# Patient Record
Sex: Male | Born: 1990 | Race: Black or African American | Hispanic: No | Marital: Married | Smoking: Former smoker
Health system: Southern US, Community
[De-identification: ages and names within clinical notes are randomized; demographics above are authoritative.]

## PROBLEM LIST (undated history)

## (undated) DIAGNOSIS — I10 Essential (primary) hypertension: Secondary | ICD-10-CM

## (undated) DIAGNOSIS — F909 Attention-deficit hyperactivity disorder, unspecified type: Secondary | ICD-10-CM

## (undated) HISTORY — PX: OTHER SURGICAL HISTORY: SHX169

---
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
## 2015-07-03 ENCOUNTER — Emergency Department: Payer: Managed Care, Other (non HMO)

## 2015-07-03 ENCOUNTER — Encounter: Payer: Self-pay | Admitting: Emergency Medicine

## 2015-07-03 ENCOUNTER — Emergency Department
Admission: EM | Admit: 2015-07-03 | Discharge: 2015-07-03 | Disposition: A | Discharge status: Home / Self Care | Payer: Managed Care, Other (non HMO) | Attending: Emergency Medicine | Admitting: Emergency Medicine

## 2015-07-03 DIAGNOSIS — Z79899 Other long term (current) drug therapy: Secondary | ICD-10-CM | POA: Diagnosis not present

## 2015-07-03 DIAGNOSIS — Y998 Other external cause status: Secondary | ICD-10-CM | POA: Insufficient documentation

## 2015-07-03 DIAGNOSIS — Y9289 Other specified places as the place of occurrence of the external cause: Secondary | ICD-10-CM | POA: Diagnosis not present

## 2015-07-03 DIAGNOSIS — S43015A Anterior dislocation of left humerus, initial encounter: Secondary | ICD-10-CM | POA: Insufficient documentation

## 2015-07-03 DIAGNOSIS — M25512 Pain in left shoulder: Secondary | ICD-10-CM

## 2015-07-03 DIAGNOSIS — Y9389 Activity, other specified: Secondary | ICD-10-CM | POA: Insufficient documentation

## 2015-07-03 DIAGNOSIS — Z87891 Personal history of nicotine dependence: Secondary | ICD-10-CM | POA: Diagnosis not present

## 2015-07-03 DIAGNOSIS — F1010 Alcohol abuse, uncomplicated: Secondary | ICD-10-CM | POA: Diagnosis not present

## 2015-07-03 DIAGNOSIS — S4992XA Unspecified injury of left shoulder and upper arm, initial encounter: Secondary | ICD-10-CM | POA: Diagnosis present

## 2015-07-03 DIAGNOSIS — W1839XA Other fall on same level, initial encounter: Secondary | ICD-10-CM | POA: Insufficient documentation

## 2015-07-03 MED ORDER — PROPOFOL 10 MG/ML IV BOLUS
INTRAVENOUS | Status: AC | PRN
  Administered 2015-07-03: 95 mg via INTRAVENOUS

## 2015-07-03 MED ORDER — ETOMIDATE 2 MG/ML IV SOLN
0.3000 mg/kg | Freq: Once | INTRAVENOUS | Status: AC
  Administered 2015-07-03: 27.9 mg via INTRAVENOUS

## 2015-07-03 MED ORDER — OXYCODONE-ACETAMINOPHEN 5-325 MG PO TABS: 1.0000 | ORAL_TABLET | Freq: Four times a day (QID) | ORAL | Status: DC | PRN

## 2015-07-03 MED ORDER — ONDANSETRON HCL 4 MG/2ML IJ SOLN
4.0000 mg | Freq: Once | INTRAMUSCULAR | Status: DC
Start: 2015-07-03 — End: 2015-07-03
  Filled 2015-07-03: qty 2

## 2015-07-03 MED ORDER — PROPOFOL 10 MG/ML IV BOLUS
INTRAVENOUS | Status: AC | PRN
  Administered 2015-07-03 (×2): 45 mg via INTRAVENOUS
  Administered 2015-07-03: 93 mg via INTRAVENOUS

## 2015-07-03 MED ORDER — PROPOFOL 1000 MG/100ML IV EMUL
INTRAVENOUS | Status: AC
  Administered 2015-07-03: 46.5 mg via INTRAVENOUS
  Filled 2015-07-03: qty 100

## 2015-07-03 MED ORDER — ONDANSETRON HCL 4 MG/2ML IJ SOLN
INTRAMUSCULAR | Status: AC
  Filled 2015-07-03: qty 2

## 2015-07-03 MED ORDER — PROPOFOL 10 MG/ML IV BOLUS
2.0000 mg/kg | Freq: Once | INTRAVENOUS | Status: AC
  Administered 2015-07-03: 186 mg via INTRAVENOUS

## 2015-07-03 MED ORDER — BUPIVACAINE HCL (PF) 0.5 % IJ SOLN
30.0000 mL | Freq: Once | INTRAMUSCULAR | Status: AC
  Administered 2015-07-03: 30 mL
  Filled 2015-07-03: qty 30

## 2015-07-03 MED ORDER — HYDROMORPHONE HCL 1 MG/ML IJ SOLN
INTRAMUSCULAR | Status: AC
  Administered 2015-07-03: 1 mg via INTRAVENOUS
  Filled 2015-07-03: qty 1

## 2015-07-03 MED ORDER — IBUPROFEN 200 MG PO TABS: 600.0000 mg | ORAL_TABLET | Freq: Four times a day (QID) | ORAL | Status: DC | PRN

## 2015-07-03 MED ORDER — HYDROMORPHONE HCL 1 MG/ML IJ SOLN
1.0000 mg | Freq: Once | INTRAMUSCULAR | Status: AC
  Administered 2015-07-03: 1 mg via INTRAVENOUS

## 2015-07-03 MED ORDER — LIDOCAINE HCL (PF) 1 % IJ SOLN
30.0000 mL | Freq: Once | INTRAMUSCULAR | Status: AC
  Administered 2015-07-03: 30 mL
  Filled 2015-07-03: qty 30

## 2015-07-03 MED ORDER — LIDOCAINE HCL (PF) 1 % IJ SOLN
INTRAMUSCULAR | Status: AC
  Administered 2015-07-03: 30 mL
  Filled 2015-07-03: qty 20

## 2015-07-03 MED ORDER — PROPOFOL 10 MG/ML IV BOLUS
0.5000 mg/kg | Freq: Once | INTRAVENOUS | Status: AC
  Administered 2015-07-03: 46.5 mg via INTRAVENOUS

## 2015-07-03 MED ORDER — SODIUM CHLORIDE 0.9 % IV BOLUS (SEPSIS): 1000.0000 mL | Freq: Once | INTRAVENOUS | Status: DC

## 2015-07-03 MED ORDER — ETOMIDATE 2 MG/ML IV SOLN
INTRAVENOUS | Status: AC
  Administered 2015-07-03: 27.9 mg via INTRAVENOUS
  Filled 2015-07-03: qty 10

## 2015-07-03 NOTE — ED Notes (Addendum)
Pt reports was drinking last night and fell landing on left shoulder.  Pt thinks is dislocated.  Did not realize last night so waited until this morning to come in.  Left shoulder appears possibly anteriorly dislocated in triage.

## 2015-07-03 NOTE — ED Provider Notes (Signed)
[facility] Emergency Department Provider Note  ____________________________________________  Time seen: 9:20 AM  I have reviewed the triage vital signs and the nursing notes.   HISTORY  Chief Complaint Shoulder Pain    HPI Nathan Beck is a 25 y.o. male complains of left shoulder pain worse with movement since last night. He reports he was drinking a lot and had a half a bottle of whiskey last night, and while intoxicated he fell down on his left arm. He then slept the rest of the night and when he woke up this morning he noticed that his arm was really hurting and difficult to move. No other injuries, no headache numbness tingling or weakness, chest pain shortness breath or abdominal pain. No vomiting or diarrhea     History reviewed. No pertinent past medical history.   There are no active problems to display for this patient.    History reviewed. No pertinent past surgical history.   Current Outpatient Rx  Name  Route  Sig  Dispense  Refill  . amphetamine-dextroamphetamine (ADDERALL) 20 MG tablet   Oral   Take 20 mg by mouth 2 (two) times daily.            Allergies Review of patient's allergies indicates no known allergies.   History reviewed. No pertinent family history.  Social History Social History  Substance Use Topics  . Smoking status: Former Games developer  . Smokeless tobacco: None  . Alcohol Use: Yes    Review of Systems  Constitutional:   No fever or chills. No weight changes Eyes:   No blurry vision or double vision.  ENT:   No sore throat. Cardiovascular:   No chest pain. Respiratory:   No dyspnea or cough. Gastrointestinal:   Negative for abdominal pain, vomiting and diarrhea.  No BRBPR or melena. Genitourinary:   Negative for dysuria, urinary retention, bloody urine, or difficulty urinating. Musculoskeletal:   Left shoulder pain as above. Skin:   Negative for rash. Neurological:   Negative for headaches, focal  weakness or numbness. Psychiatric:  No anxiety or depression.   Endocrine:  No hot/cold intolerance, changes in energy, or sleep difficulty.  10-point ROS otherwise negative.  ____________________________________________   PHYSICAL EXAM:  VITAL SIGNS: ED Triage Vitals  Enc Vitals Group     BP 07/03/15 0917 149/75 mmHg     Pulse Rate 07/03/15 0917 109     Resp 07/03/15 0917 18     Temp 07/03/15 0917 98.1 F (36.7 C)     Temp Source 07/03/15 0917 Oral     SpO2 07/03/15 0917 96 %     Weight 07/03/15 0917 205 lb (92.987 kg)     Height 07/03/15 0917  (1.778 m)     Head Cir --      Peak Flow --      Pain Score 07/03/15 0918 8     Pain Loc --      Pain Edu? --      Excl. in GC? --     Vital signs reviewed, nursing assessments reviewed.   Constitutional:   Alert and oriented. Well appearing and in no distress. Eyes:   No scleral icterus. No conjunctival pallor. PERRL. EOMI ENT   Head:   Normocephalic and atraumatic.   Nose:   No congestion/rhinnorhea. No septal hematoma   Mouth/Throat:   MMM, no pharyngeal erythema. No peritonsillar mass. No uvula shift.   Neck:   No stridor. No SubQ emphysema. No meningismus.  Hematological/Lymphatic/Immunilogical:   No cervical lymphadenopathy. Cardiovascular:   RRR. Normal and symmetric distal pulses are present in all extremities. No murmurs, rubs, or gallops. Respiratory:   Normal respiratory effort without tachypnea nor retractions. Breath sounds are clear and equal bilaterally. No wheezes/rales/rhonchi. Gastrointestinal:   Soft and nontender. No distention. There is no CVA tenderness.  No rebound, rigidity, or guarding. Genitourinary:   deferred Musculoskeletal:   Tenderness around the left shoulder. There is anterior inferior fullness with humeral head palpable in the axilla. There is soft tissue emptiness over the lateral deltoid. In the distribution of the axillary nerve he does report some paresthesia with light  touch.. Neurologic:   Normal speech and language.  CN 2-10 normal. Motor grossly intact. Normal gait. No gross focal neurologic deficits are appreciated.  Skin:    Skin is warm, dry and intact. No rash noted.  No petechiae, purpura, or bullae. Psychiatric:   Mood and affect are normal. Speech and behavior are normal. Patient exhibits appropriate insight and judgment.  ____________________________________________    LABS (pertinent positives/negatives) (all labs ordered are listed, but only abnormal results are displayed) Labs Reviewed - No data to display ____________________________________________   EKG    ____________________________________________    RADIOLOGY  Initial x-ray left shoulder shows anterior dislocation Repeat x-ray left shoulder shows persistent anterior dislocation of left shoulder, no change. Third x-ray left shoulder shows persistent anterior dislocation of left shoulder no change. Fourth x-ray left shoulder report at 4:15 PM shows successful reduction without any other injuries. ____________________________________________   PROCEDURES Procedural sedation Performed by: Sharman Cheek Consent: Verbal and written consent obtained. Risks and benefits: risks, benefits and alternatives were discussed Required items: required blood products, implants, devices, and special equipment available Patient identity confirmed: arm band and provided demographic data Time out: Immediately prior to procedure a "time out" was called to verify the correct patient, procedure, equipment, support staff and site/side marked as required.  Sedation type: moderate (conscious) sedation for reduction of left shoulder dislocation .NPO time confirmed and considedered. Nothing by mouth since yesterday.  Sedatives: ETOMIDATE 28 mg   Physician Time at Bedside: 15 minutes  Vitals: Vital signs were monitored during sedation. Cardiac Monitor, pulse oximeter Patient tolerance:  Patient tolerated the procedure well with no immediate complications. Comments: Pt with uneventful recovered. Returned to pre-procedural sedation baseline. Sedation start time 12:05 PM Sedation end time 12:20 PM, patient back to baseline. Patient achieved only light sedation and experienced twitching related to the etomidate briefly.  Reduction of dislocation Date/Time: 12:10pm Performed by: Scotty Court, Alphus Zeck Authorized by: Sharman Cheek Consent: Verbal consent obtained. Risks and benefits: risks, benefits and alternatives were discussed Consent given by: patient Required items: required blood products, implants, devices, and special equipment available Time out: Immediately prior to procedure a "time out" was called to verify the correct patient, procedure, equipment, support staff and site/side marked as required.  Patient sedated: Etomidate yes  Vitals: Vital signs were monitored during sedation. Patient tolerance: Patient tolerated the procedure well with no immediate complications. Joint: Shoulder Reduction technique: Traction countertraction, external rotation and abduction  Attempt unsuccessful.     Procedural sedation Performed by: Sharman Cheek Consent: Verbal and written consent obtained. Risks and benefits: risks, benefits and alternatives were discussed Required items: required blood products, implants, devices, and special equipment available Patient identity confirmed: arm band and provided demographic data Time out: Immediately prior to procedure a "time out" was called to verify the correct patient, procedure, equipment, support staff and site/side marked as  required.  Sedation type: moderate (conscious) sedation NPO time confirmed and considedered  Sedatives: PROPOFOL  Physician Time at Bedside: 25 minutes  Vitals: Vital signs were monitored during sedation. Cardiac Monitor, pulse oximeter Patient tolerance: Patient expresses bradycardia without  hypotension with lowest heart rate of about 38. He also initially experienced hypoxia with lowest oxygen saturation of 80%, rapidly returned 100% with admionistration of nonrebreather oxygen. . Comments: Pt with uneventful recovered. Returned to pre-procedural sedation baseline  sedation start time 12:50 PM Sedation end time 1:30 PM Total propofol given 190 mg  Reduction of dislocation Date/Time: 1:00pm Performed by: Scotty Court, Brick Ketcher Authorized by: Sharman Cheek Consent: Verbal consent obtained. Risks and benefits: risks, benefits and alternatives were discussed Consent given by: patient Required items: required blood products, implants, devices, and special equipment available Time out: Immediately prior to procedure a "time out" was called to verify the correct patient, procedure, equipment, support staff and site/side marked as required.  Patient sedated: Yes with a propofol  Vitals: Vital signs were monitored during sedation. Patient tolerance: Patient tolerated the procedure well with no immediate complications. Joint: Left shoulder Reduction technique: Traction countertraction, external rotation and abduction   attempt unsuccessful   Procedural sedation Performed by: Scotty Court, Holland Nickson Consent: Verbal consent obtained. Risks and benefits: risks, benefits and alternatives were discussed Required items: required blood products, implants, devices, and special equipment available Patient identity confirmed: arm band and provided demographic data Time out: Immediately prior to procedure a "time out" was called to verify the correct patient, procedure, equipment, support staff and site/side marked as required.  Sedation type: moderate (conscious) sedation NPO time confirmed and considedered  Sedatives: PROPOFOL 2 mg/kg   Physician Time at Bedside: 15 minutes  Vitals: Vital signs were monitored during sedation. Cardiac Monitor, pulse oximeter Patient tolerance: Patient  tolerated the procedure well with no immediate complications. Comments: Pt with uneventful recovered. Returned to pre-procedural sedation baseline  patient was placed on nonrebreather prior to administration of propofol out of caution, did not have any episodes of hypoxia during the procedure.      ____________________________________________   INITIAL IMPRESSION / ASSESSMENT AND PLAN / ED COURSE  Pertinent labs & imaging results that were available during my care of the patient were reviewed by me and considered in my medical decision making (see chart for details).  Patient presents with left shoulder anterior dislocation. The shoulder is been out of joint for 10-12 hours. The patient is very muscular and I can feel that the area is very tensed up. Multiple attempts at sedation and reduction were unsuccessful. Because of this, I discussed the case with orthopedics Dr. Joice Lofts at 2:20 PM who will evaluate the patient for continued management. Patient has received 2 doses of IV Dilaudid for pain control in the meantime.    ----------------------------------------- 3:50 PM on 07/03/2015 -----------------------------------------  Patient sedated the third time with Dr. Joice Lofts at the bedside to do reduction. Clinically it appears that this reduction was successful. Will follow-up with x-ray and then plan to discharge for clinic follow-up.   ----------------------------------------- 4:18 PM on 07/03/2015 -----------------------------------------  Reduction successful confirmed by follow-up x-ray. We'll discharge home in shoulder immobilizer with NSAIDs and Percocet follow-up with Dr. Joice Lofts.  ____________________________________________   FINAL CLINICAL IMPRESSION(S) / ED DIAGNOSES  Final diagnoses:  Anterior dislocation of left shoulder, initial encounter  Alcohol abuse      Sharman Cheek, MD 07/03/15 [phone]

## 2015-07-03 NOTE — Sedation Documentation (Signed)
After first dose of Propofol, pt began to desat on monitor, placed on non rebreather. Vitals stable at this time

## 2015-07-03 NOTE — Sedation Documentation (Signed)
Pt taken off non rebreather without difficulty, tolerating RA

## 2015-07-03 NOTE — Discharge Instructions (Signed)
You were prescribed a medication that is potentially sedating. Do not drink alcohol, drive or participate in any other potentially dangerous activities while taking this medication as it may make you sleepy. Do not take this medication with any other sedating medications, either prescription or over-the-counter. If you were prescribed Percocet or Vicodin, do not take these with acetaminophen (Tylenol) as it is already contained within these medications.   Opioid pain medications (or "narcotics") can be habit forming.  Use it as little as possible to achieve adequate pain control.  Do not use or use it with extreme caution if you have a history of opiate abuse or dependence.  If you are on a pain contract with your primary care doctor or a pain specialist, be sure to let them know you were prescribed this medication today from the [facility] Emergency Department.  This medication is intended for your use only - do not give any to anyone else and keep it in a secure place where nobody else, especially children and pets, have access to it.  It will also cause or worsen constipation, so you may want to consider taking an over-the-counter stool softener while you are taking this medication.  Shoulder Dislocation A shoulder dislocation happens when the upper arm bone (humerus) moves out of the shoulder joint. The shoulder joint is the part of the shoulder where the humerus, shoulder blade (scapula), and collarbone (clavicle) meet. CAUSES This condition is often caused by:  A fall.  A hit to the shoulder.  A forceful movement of the shoulder. RISK FACTORS This condition is more likely to develop in people who play sports. SYMPTOMS Symptoms of this condition include:  Deformity of the shoulder.  Intense pain.  Inability to move the shoulder.  Numbness, weakness, or tingling in your neck or down your arm.  Bruising or swelling around your shoulder. DIAGNOSIS This condition is diagnosed  with a physical exam. After the exam, tests may be done to check for related problems. Tests that may be done include:  X-ray. This may be done to check for broken bones.  MRI. This may be done to check for damage to the tissues around the shoulder.  Electromyogram. This may be done to check for nerve damage. TREATMENT This condition is treated with a procedure to place the humerus back in the joint. This procedure is called a reduction. There are two types of reduction:  Closed reduction. In this procedure, the humerus is placed back in the joint without surgery. The health care provider uses his or her hands to guide the bone back into place.  Open reduction. In this procedure, the humerus is placed back in the joint with surgery. An open reduction may be recommended if:  You have a weak shoulder joint or weak ligaments.  You have had more than one shoulder dislocation.  The nerves or blood vessels around your shoulder have been damaged. After the humerus is placed back into the joint, your arm will be placed in a splint or sling to prevent it from moving. You will need to wear the splint or sling until your shoulder heals. When the splint or sling is removed, you may have physical therapy to help improve the range of motion in your shoulder joint. HOME CARE INSTRUCTIONS If You Have a Splint or Sling:  Wear it as told by your health care provider. Remove it only as told by your health care provider.  Loosen it if your fingers become numb and tingle,  or if they turn cold and blue.  Keep it clean and dry. Bathing  Do not take baths, swim, or use a hot tub until your health care provider approves. Ask your health care provider if you can take showers. You may only be allowed to take sponge baths for bathing.  If your health care provider approves bathing and showering, cover your splint or sling with a watertight plastic bag to protect it from water. Do not let the splint or sling get  wet. Managing Pain, Stiffness, and Swelling  If directed, apply ice to the injured area.  Put ice in a plastic bag.  Place a towel between your skin and the bag.  Leave the ice on for 20 minutes, 2-3 times per day.  Move your fingers often to avoid stiffness and to decrease swelling.  Raise (elevate) the injured area above the level of your heart while you are sitting or lying down. Driving  Do not drive while wearing a splint or sling on a hand that you use for driving.  Do not drive or operate heavy machinery while taking pain medicine. Activity  Return to your normal activities as told by your health care provider. Ask your health care provider what activities are safe for you.  Perform range-of-motion exercises only as told by your health care provider.  Exercise your hand by squeezing a soft ball. This helps to decrease stiffness and swelling in your hand and wrist. General Instructions  Take over-the-counter and prescription medicines only as told by your health care provider.  Do not use any tobacco products, including cigarettes, chewing tobacco, or e-cigarettes. Tobacco can delay bone and tissue healing. If you need help quitting, ask your health care provider.  Keep all follow-up visits as told by your health care provider. This is important. SEEK MEDICAL CARE IF:  Your splint or sling gets damaged. SEEK IMMEDIATE MEDICAL CARE IF:  Your pain gets worse rather than better.  You lose feeling in your arm or hand.  Your arm or hand becomes white and cold.   This information is not intended to replace advice given to you by your health care provider. Make sure you discuss any questions you have with your health care provider.   Document Released: 01/30/2001 Document Revised: 01/26/2015 Document Reviewed: 08/30/2014 Elsevier Interactive Patient Education 2016 [employer]  Shoulder Pain The shoulder is the joint that connects your arms to your body. The bones  that form the shoulder joint include the upper arm bone (humerus), the shoulder blade (scapula), and the collarbone (clavicle). The top of the humerus is shaped like a ball and fits into a rather flat socket on the scapula (glenoid cavity). A combination of muscles and strong, fibrous tissues that connect muscles to bones (tendons) support your shoulder joint and hold the ball in the socket. Small, fluid-filled sacs (bursae) are located in different areas of the joint. They act as cushions between the bones and the overlying soft tissues and help reduce friction between the gliding tendons and the bone as you move your arm. Your shoulder joint allows a wide range of motion in your arm. This range of motion allows you to do things like scratch your back or throw a ball. However, this range of motion also makes your shoulder more prone to pain from overuse and injury. Causes of shoulder pain can originate from both injury and overuse and usually can be grouped in the following four categories:  Redness, swelling, and pain (inflammation) of  the tendon (tendinitis) or the bursae (bursitis).  Instability, such as a dislocation of the joint.  Inflammation of the joint (arthritis).  Broken bone (fracture). HOME CARE INSTRUCTIONS   Apply ice to the sore area.  Put ice in a plastic bag.  Place a towel between your skin and the bag.  Leave the ice on for 15-20 minutes, 3-4 times per day for the first 2 days, or as directed by your health care provider.  Stop using cold packs if they do not help with the pain.  If you have a shoulder sling or immobilizer, wear it as long as your caregiver instructs. Only remove it to shower or bathe. Move your arm as little as possible, but keep your hand moving to prevent swelling.  Squeeze a soft ball or foam pad as much as possible to help prevent swelling.  Only take over-the-counter or prescription medicines for pain, discomfort, or fever as directed by your  caregiver. SEEK MEDICAL CARE IF:   Your shoulder pain increases, or new pain develops in your arm, hand, or fingers.  Your hand or fingers become cold and numb.  Your pain is not relieved with medicines. SEEK IMMEDIATE MEDICAL CARE IF:   Your arm, hand, or fingers are numb or tingling.  Your arm, hand, or fingers are significantly swollen or turn white or blue. MAKE SURE YOU:   Understand these instructions.  Will watch your condition.  Will get help right away if you are not doing well or get worse.   This information is not intended to replace advice given to you by your health care provider. Make sure you discuss any questions you have with your health care provider.   Document Released: 02/14/2005 Document Revised: 05/28/2014 Document Reviewed: 08/30/2014 Elsevier Interactive Patient Education [employer]

## 2021-10-09 ENCOUNTER — Inpatient Hospital Stay
Admission: EM | Admit: 2021-10-09 | Discharge: 2021-10-11 | DRG: 392 | Disposition: A | Discharge status: Home / Self Care | Payer: BC Managed Care – PPO | Attending: Obstetrics and Gynecology | Admitting: Obstetrics and Gynecology

## 2021-10-09 ENCOUNTER — Emergency Department: Payer: BC Managed Care – PPO

## 2021-10-09 ENCOUNTER — Encounter: Payer: Self-pay | Admitting: Emergency Medicine

## 2021-10-09 ENCOUNTER — Other Ambulatory Visit: Payer: Self-pay

## 2021-10-09 ENCOUNTER — Inpatient Hospital Stay: Payer: BC Managed Care – PPO

## 2021-10-09 DIAGNOSIS — Z8249 Family history of ischemic heart disease and other diseases of the circulatory system: Secondary | ICD-10-CM

## 2021-10-09 DIAGNOSIS — R112 Nausea with vomiting, unspecified: Secondary | ICD-10-CM | POA: Diagnosis present

## 2021-10-09 DIAGNOSIS — N50811 Right testicular pain: Secondary | ICD-10-CM

## 2021-10-09 DIAGNOSIS — K5732 Diverticulitis of large intestine without perforation or abscess without bleeding: Principal | ICD-10-CM | POA: Diagnosis present

## 2021-10-09 DIAGNOSIS — Z20822 Contact with and (suspected) exposure to covid-19: Secondary | ICD-10-CM | POA: Diagnosis present

## 2021-10-09 DIAGNOSIS — K5792 Diverticulitis of intestine, part unspecified, without perforation or abscess without bleeding: Secondary | ICD-10-CM | POA: Diagnosis not present

## 2021-10-09 DIAGNOSIS — F909 Attention-deficit hyperactivity disorder, unspecified type: Secondary | ICD-10-CM | POA: Diagnosis not present

## 2021-10-09 DIAGNOSIS — R0902 Hypoxemia: Secondary | ICD-10-CM | POA: Diagnosis not present

## 2021-10-09 DIAGNOSIS — R1030 Lower abdominal pain, unspecified: Secondary | ICD-10-CM

## 2021-10-09 DIAGNOSIS — Z87891 Personal history of nicotine dependence: Secondary | ICD-10-CM | POA: Diagnosis not present

## 2021-10-09 DIAGNOSIS — I10 Essential (primary) hypertension: Secondary | ICD-10-CM | POA: Diagnosis present

## 2021-10-09 DIAGNOSIS — N433 Hydrocele, unspecified: Secondary | ICD-10-CM | POA: Diagnosis present

## 2021-10-09 HISTORY — DX: Attention-deficit hyperactivity disorder, unspecified type: F90.9

## 2021-10-09 HISTORY — DX: Essential (primary) hypertension: I10

## 2021-10-09 LAB — URINALYSIS, ROUTINE W REFLEX MICROSCOPIC
Bacteria, UA: NONE SEEN
Bilirubin Urine: NEGATIVE
Glucose, UA: NEGATIVE mg/dL
Ketones, ur: 20 mg/dL — AB
Leukocytes,Ua: NEGATIVE
Nitrite: NEGATIVE
Protein, ur: NEGATIVE mg/dL
Specific Gravity, Urine: >1.046 — ABNORMAL HIGH (ref 1.005–1.030)
Squamous Epithelial / HPF: NONE SEEN (ref 0–5)
pH: 7 (ref 5.0–8.0)

## 2021-10-09 LAB — COMPREHENSIVE METABOLIC PANEL
ALT: 13 U/L (ref 0–44)
AST: 26 U/L (ref 15–41)
Albumin: 4.1 g/dL (ref 3.5–5.0)
Alkaline Phosphatase: 54 U/L (ref 38–126)
Anion gap: 11 (ref 5–15)
BUN: 20 mg/dL (ref 6–20)
CO2: 22 mmol/L (ref 22–32)
Calcium: 9.1 mg/dL (ref 8.9–10.3)
Chloride: 102 mmol/L (ref 98–111)
Creatinine, Ser: 1.1 mg/dL (ref 0.61–1.24)
GFR, Estimated: >60 mL/min (ref >60)
Glucose, Bld: 160 mg/dL — ABNORMAL HIGH (ref 70–99)
Potassium: 3.5 mmol/L (ref 3.5–5.1)
Sodium: 135 mmol/L (ref 135–145)
Total Bilirubin: 1.5 mg/dL — ABNORMAL HIGH (ref 0.3–1.2)
Total Protein: 7.7 g/dL (ref 6.5–8.1)

## 2021-10-09 LAB — CBC WITH DIFFERENTIAL/PLATELET
Abs Immature Granulocytes: 0.04 10*3/uL (ref 0.00–0.07)
Basophils Absolute: 0 10*3/uL (ref 0.0–0.1)
Basophils Relative: 0 %
Eosinophils Absolute: 0 10*3/uL (ref 0.0–0.5)
Eosinophils Relative: 0 %
HCT: 40.9 % (ref 39.0–52.0)
Hemoglobin: 13.7 g/dL (ref 13.0–17.0)
Immature Granulocytes: 0 %
Lymphocytes Relative: 11 %
Lymphs Abs: 1.2 10*3/uL (ref 0.7–4.0)
MCH: 31.2 pg (ref 26.0–34.0)
MCHC: 33.5 g/dL (ref 30.0–36.0)
MCV: 93.2 fL (ref 80.0–100.0)
Monocytes Absolute: 0.7 10*3/uL (ref 0.1–1.0)
Monocytes Relative: 6 %
Neutro Abs: 8.8 10*3/uL — ABNORMAL HIGH (ref 1.7–7.7)
Neutrophils Relative %: 83 %
Platelets: 240 10*3/uL (ref 150–400)
RBC: 4.39 MIL/uL (ref 4.22–5.81)
RDW: 12.2 % (ref 11.5–15.5)
WBC: 10.8 10*3/uL — ABNORMAL HIGH (ref 4.0–10.5)
nRBC: 0 % (ref 0.0–0.2)

## 2021-10-09 LAB — RESP PANEL BY RT-PCR (FLU A&B, COVID) ARPGX2
Influenza A by PCR: NEGATIVE
Influenza B by PCR: NEGATIVE
SARS Coronavirus 2 by RT PCR: NEGATIVE

## 2021-10-09 LAB — PROTIME-INR
INR: 1.1 (ref 0.8–1.2)
Prothrombin Time: 13.7 seconds (ref 11.4–15.2)

## 2021-10-09 LAB — HIV ANTIBODY (ROUTINE TESTING W REFLEX): HIV Screen 4th Generation wRfx: NONREACTIVE

## 2021-10-09 LAB — LIPASE, BLOOD: Lipase: 24 U/L (ref 11–51)

## 2021-10-09 MED ORDER — HYDRALAZINE HCL 20 MG/ML IJ SOLN: 5.0000 mg | INTRAMUSCULAR | Status: DC | PRN

## 2021-10-09 MED ORDER — SODIUM CHLORIDE 0.9 % IV SOLN: INTRAVENOUS | Status: DC

## 2021-10-09 MED ORDER — ONDANSETRON HCL 4 MG/2ML IJ SOLN
4.0000 mg | Freq: Once | INTRAMUSCULAR | Status: AC
  Administered 2021-10-09: 4 mg via INTRAVENOUS
  Filled 2021-10-09: qty 2

## 2021-10-09 MED ORDER — MORPHINE SULFATE (PF) 2 MG/ML IV SOLN
2.0000 mg | INTRAVENOUS | Status: DC | PRN
  Administered 2021-10-09: 2 mg via INTRAVENOUS
  Filled 2021-10-09: qty 1

## 2021-10-09 MED ORDER — IOHEXOL 300 MG/ML  SOLN
100.0000 mL | Freq: Once | INTRAMUSCULAR | Status: AC | PRN
  Administered 2021-10-09: 100 mL via INTRAVENOUS

## 2021-10-09 MED ORDER — MORPHINE SULFATE (PF) 2 MG/ML IV SOLN
2.0000 mg | Freq: Once | INTRAVENOUS | Status: DC
  Filled 2021-10-09: qty 1

## 2021-10-09 MED ORDER — KETOROLAC TROMETHAMINE 30 MG/ML IJ SOLN
30.0000 mg | Freq: Once | INTRAMUSCULAR | Status: AC
  Administered 2021-10-09: 30 mg via INTRAVENOUS
  Filled 2021-10-09: qty 1

## 2021-10-09 MED ORDER — DM-GUAIFENESIN ER 30-600 MG PO TB12: 1.0000 | ORAL_TABLET | Freq: Two times a day (BID) | ORAL | Status: DC | PRN

## 2021-10-09 MED ORDER — MORPHINE SULFATE (PF) 4 MG/ML IV SOLN
4.0000 mg | Freq: Once | INTRAVENOUS | Status: AC
  Administered 2021-10-09: 4 mg via INTRAVENOUS
  Filled 2021-10-09: qty 1

## 2021-10-09 MED ORDER — ACETAMINOPHEN 325 MG PO TABS: 650.0000 mg | ORAL_TABLET | Freq: Four times a day (QID) | ORAL | Status: DC | PRN

## 2021-10-09 MED ORDER — PIPERACILLIN-TAZOBACTAM 3.375 G IVPB 30 MIN
3.3750 g | Freq: Once | INTRAVENOUS | Status: AC
Start: 2021-10-09 — End: 2021-10-09
  Administered 2021-10-09: 3.375 g via INTRAVENOUS
  Filled 2021-10-09: qty 50

## 2021-10-09 MED ORDER — ONDANSETRON HCL 4 MG/2ML IJ SOLN: 4.0000 mg | Freq: Three times a day (TID) | INTRAMUSCULAR | Status: DC | PRN

## 2021-10-09 MED ORDER — ENOXAPARIN SODIUM 40 MG/0.4ML IJ SOSY
40.0000 mg | PREFILLED_SYRINGE | INTRAMUSCULAR | Status: DC
  Administered 2021-10-09 – 2021-10-10 (×2): 40 mg via SUBCUTANEOUS
  Filled 2021-10-09 (×2): qty 0.4

## 2021-10-09 MED ORDER — ALBUTEROL SULFATE (2.5 MG/3ML) 0.083% IN NEBU
2.5000 mg | INHALATION_SOLUTION | RESPIRATORY_TRACT | Status: DC | PRN
Start: 2021-10-09 — End: 2021-10-11

## 2021-10-09 MED ORDER — OXYCODONE-ACETAMINOPHEN 5-325 MG PO TABS
1.0000 | ORAL_TABLET | ORAL | Status: DC | PRN
  Administered 2021-10-10 – 2021-10-11 (×3): 1 via ORAL
  Filled 2021-10-09 (×3): qty 1

## 2021-10-09 MED ORDER — PIPERACILLIN-TAZOBACTAM 3.375 G IVPB
3.3750 g | Freq: Three times a day (TID) | INTRAVENOUS | Status: DC
  Administered 2021-10-09 – 2021-10-11 (×6): 3.375 g via INTRAVENOUS
  Filled 2021-10-09 (×6): qty 50

## 2021-10-09 MED ORDER — SODIUM CHLORIDE 0.9 % IV BOLUS (SEPSIS)
1000.0000 mL | Freq: Once | INTRAVENOUS | Status: AC
  Administered 2021-10-09: 1000 mL via INTRAVENOUS

## 2021-10-09 NOTE — Assessment & Plan Note (Signed)
Patient developed oxygen desaturation after received 2 dose of IV morphine.  Patient denies any chest pain or shortness breath currently.  Oxygen saturation 100% on 2 L oxygen currently.  Chest x-ray negative. -Supportive care -As needed albuterol -Check COVID19 PCR

## 2021-10-09 NOTE — H&P (Signed)
History and Physical    Nathan Beck PJK:[id number] DOB: 1990-05-31 DOA: 10/09/2021  Referring MD/NP/PA:   PCP: Pcp, No   Patient coming from:  The patient is coming from home.  At baseline, pt is independent for most of ADL.        Chief Complaint: Abdominal pain  HPI: Nathan Beck is a 31 y.o. male with medical history significant of hypertension, ADHD, who presents with abdominal pain.  Patient states that his abdomen started yesterday, which is located in the lower abdomen, constant, initially 10 out of 10 in severity, currently 4 out of 10 in severity, sharp, radiating to the peritoneal area.  Associated with nausea and multiple episodes of nonbilious nonbloody vomiting.  Patient can not take anything down. No diarrhea.  No fever or chills.  Patient does not have chest pain, cough, shortness breath.  No symptoms of UTI.  Per report, patient had normal oxygen saturation initially, but after receiving 2 doses of morphine (2 mg and 4 mg of IV) in ED, patient had oxygen desaturation to 70s% on room air, which improved to 100% on 2 L oxygen.  Denies any shortness breath chest pain or shortness breath.  Data Reviewed and ED Course: pt was found to have WBC 10.8, lipase 24, normal liver function, GFR> 60, temperature normal, blood pressure 132/87, heart rate 90, 66, 18.  CT abdomen/pelvis that showed sigmoid diverticulitis without abscess formation.  Patient is admitted to MedSurg bed as inpatient  US-scrotum: Unremarkable scrotal ultrasound. Specifically, no findings to suggest testicular torsion or epididymal orchitis. No testicular mass lesion.  CT-abd/pelvis 1. Diffuse colonic diverticulosis with ill-defined wall thickening in the sigmoid colon with pericolonic edema/inflammation. Imaging features are most suggestive of sigmoid colon diverticulitis. No evidence for perforation or abscess. 2. Tiny hiatal hernia.   EKG: Not done in ED, will get one.    Review of Systems:    General: no fevers, chills, no body weight gain, has poor appetite, has fatigue HEENT: no blurry vision, hearing changes or sore throat Respiratory: no dyspnea, coughing, wheezing CV: no chest pain, no palpitations GI: has nausea, vomiting, abdominal pain, no diarrhea, constipation GU: no dysuria, burning on urination, increased urinary frequency, hematuria  Ext: no leg edema Neuro: no unilateral weakness, numbness, or tingling, no vision change or hearing loss Skin: no rash, no skin tear. MSK: No muscle spasm, no deformity, no limitation of range of movement in spin Heme: No easy bruising.  Travel history: No recent long distant travel.   Allergy: No Known Allergies  Past Medical History:  Diagnosis Date   ADHD    HTN (hypertension)     Past Surgical History:  Procedure Laterality Date   dental procedure      Social History:  reports that he has quit smoking. He has never used smokeless tobacco. He reports that he does not currently use alcohol. He reports that he does not use drugs.  Family History:  Family History  Problem Relation Age of Onset   Hypertension Mother    Diverticulitis Mother    Hypertension Father      Prior to Admission medications   Medication Sig Start Date End Date Taking? Authorizing Provider  amphetamine-dextroamphetamine (ADDERALL) 20 MG tablet Take 20 mg by mouth 2 (two) times daily. Patient not taking: Reported on 10/09/2021 07/01/15   [provider]  ibuprofen (MOTRIN IB) 200 MG tablet Take 3 tablets (600 mg total) by mouth every 6 (six) hours as needed. Patient not taking:  Reported on 10/09/2021 07/03/15   Sharman Cheek, MD  oxyCODONE-acetaminophen (ROXICET) 5-325 MG tablet Take 1 tablet by mouth every 6 (six) hours as needed for severe pain. Patient not taking: Reported on 10/09/2021 07/03/15   Sharman Cheek, MD    Physical Exam: Vitals:   10/09/21 0610 10/09/21 0626 10/09/21 0707 10/09/21 0930  BP:   132/87 122/70   Pulse: 61 60 66 (!) 56  Resp:   18 18  Temp:      TempSrc:      SpO2: (!) 81% 100% 100% 100%  Weight:      Height:       General: Not in acute distress HEENT:       Eyes: PERRL, EOMI, no scleral icterus.       ENT: No discharge from the ears and nose, no pharynx injection, no tonsillar enlargement.        Neck: No JVD, no bruit, no mass felt. Heme: No neck lymph node enlargement. Cardiac: S1/S2, RRR, No murmurs, No gallops or rubs. Respiratory: No rales, wheezing, rhonchi or rubs. GI: Soft, nondistended, has tenderness in lower abdomen. no rebound pain, no organomegaly, BS present. GU: No hematuria Ext: No pitting leg edema bilaterally. 1+DP/PT pulse bilaterally. Musculoskeletal: No joint deformities, No joint redness or warmth, no limitation of ROM in spin. Skin: No rashes.  Neuro: Alert, oriented X3, cranial nerves II-XII grossly intact, moves all extremities normally.  Psych: Patient is not psychotic, no suicidal or hemocidal ideation.  Labs on Admission: I have personally reviewed following labs and imaging studies  CBC: Recent Labs  Lab 10/09/21 0604  WBC 10.8*  NEUTROABS 8.8*  HGB 13.7  HCT 40.9  MCV 93.2  PLT 240   Basic Metabolic Panel: Recent Labs  Lab 10/09/21 0604  NA 135  K 3.5  CL 102  CO2 22  GLUCOSE 160*  BUN 20  CREATININE 1.10  CALCIUM 9.1   GFR: Estimated Creatinine Clearance: 110.2 mL/min (by C-G formula based on SCr of 1.1 mg/dL). Liver Function Tests: Recent Labs  Lab 10/09/21 0604  AST 26  ALT 13  ALKPHOS 54  BILITOT 1.5*  PROT 7.7  ALBUMIN 4.1   Recent Labs  Lab 10/09/21 0604  LIPASE 24   No results for input(s): AMMONIA in the last 168 hours. Coagulation Profile: Recent Labs  Lab 10/09/21 0604  INR 1.1   Cardiac Enzymes: No results for input(s): CKTOTAL, CKMB, CKMBINDEX, TROPONINI in the last 168 hours. BNP (last 3 results) No results for input(s): PROBNP in the last 8760 hours. HbA1C: No results for input(s):  HGBA1C in the last 72 hours. CBG: No results for input(s): GLUCAP in the last 168 hours. Lipid Profile: No results for input(s): CHOL, HDL, LDLCALC, TRIG, CHOLHDL, LDLDIRECT in the last 72 hours. Thyroid Function Tests: No results for input(s): TSH, T4TOTAL, FREET4, T3FREE, THYROIDAB in the last 72 hours. Anemia Panel: No results for input(s): VITAMINB12, FOLATE, FERRITIN, TIBC, IRON, RETICCTPCT in the last 72 hours. Urine analysis: No results found for: COLORURINE, APPEARANCEUR, LABSPEC, PHURINE, GLUCOSEU, HGBUR, BILIRUBINUR, KETONESUR, PROTEINUR, UROBILINOGEN, NITRITE, LEUKOCYTESUR Sepsis Labs: @LABRCNTIP (procalcitonin:4,lacticidven:4) )No results found for this or any previous visit (from the past 240 hour(s)).   Radiological Exams on Admission: CT ABDOMEN PELVIS W CONTRAST  Result Date: 10/09/2021 CLINICAL DATA:  Abdominal pain and vomiting. EXAM: CT ABDOMEN AND PELVIS WITH CONTRAST TECHNIQUE: Multidetector CT imaging of the abdomen and pelvis was performed using the standard protocol following bolus administration of intravenous contrast. RADIATION DOSE REDUCTION:  This exam was performed according to the departmental dose-optimization program which includes automated exposure control, adjustment of the mA and/or kV according to patient size and/or use of iterative reconstruction technique. CONTRAST:  100mL OMNIPAQUE IOHEXOL 300 MG/ML  SOLN COMPARISON:  None Available. FINDINGS: Lower chest: Unremarkable. Hepatobiliary: No suspicious focal abnormality within the liver parenchyma. There is no evidence for gallstones, gallbladder wall thickening, or pericholecystic fluid. No intrahepatic or extrahepatic biliary dilation. Pancreas: No focal mass lesion. No dilatation of the main duct. No intraparenchymal cyst. No peripancreatic edema. Spleen: No splenomegaly. No focal mass lesion. Adrenals/Urinary Tract: No adrenal nodule or mass. Kidneys unremarkable. No evidence for hydroureter. The urinary  bladder appears normal for the degree of distention. Stomach/Bowel: Tiny hiatal hernia. Stomach is unremarkable. No gastric wall thickening. No evidence of outlet obstruction. No small bowel wall thickening. No small bowel dilatation. The terminal ileum is normal. The appendix is normal. Diverticular changes are seen in the colon diffusely. There is ill-defined wall thickening in the sigmoid colon with pericolonic edema/inflammation. Imaging features are most suggestive of sigmoid colon diverticulitis. No evidence for perforation or abscess. Vascular/Lymphatic: No abdominal aortic aneurysm. No abdominal aortic atherosclerotic calcification. There is no gastrohepatic or hepatoduodenal ligament lymphadenopathy. No retroperitoneal or mesenteric lymphadenopathy. No pelvic sidewall lymphadenopathy. Reproductive: The prostate gland and seminal vesicles are unremarkable. Other: Trace free fluid noted in the pelvis. Musculoskeletal: Tiny umbilical hernia contains only fat. No worrisome lytic or sclerotic osseous abnormality. IMPRESSION: 1. Diffuse colonic diverticulosis with ill-defined wall thickening in the sigmoid colon with pericolonic edema/inflammation. Imaging features are most suggestive of sigmoid colon diverticulitis. No evidence for perforation or abscess. 2. Tiny hiatal hernia. Electronically Signed   By: Kennith CenterEric  Mansell M.D.   On: 10/09/2021 07:10   DG Chest Port 1 View  Result Date: 10/09/2021 CLINICAL DATA:  Shortness of breath. EXAM: PORTABLE CHEST 1 VIEW COMPARISON:  None Available. FINDINGS: The heart size and mediastinal contours are within normal limits. Both lungs are clear. The visualized skeletal structures are unremarkable. IMPRESSION: No active disease. Electronically Signed   By: Obie DredgeWilliam T Derry M.D.   On: 10/09/2021 10:05   US SCROTUM W/DOPPLER  Result Date: 10/09/2021 CLINICAL DATA:  2 day history of right testicular pain. EXAM: SCROTAL ULTRASOUND DOPPLER ULTRASOUND OF THE TESTICLES  TECHNIQUE: Complete ultrasound examination of the testicles, epididymis, and other scrotal structures was performed. Color and spectral Doppler ultrasound were also utilized to evaluate blood flow to the testicles. COMPARISON:  None Available. FINDINGS: Right testicle Measurements: 4.6 x 2.6 x 2.3 cm. No mass or microlithiasis visualized. Left testicle Measurements: 4.6 x 2.2 x 2.4 cm. No mass or microlithiasis visualized. Right epididymis:  Normal in size and appearance. Left epididymis:  Normal in size and appearance. Hydrocele:  Physiologic volume fluid identified in each hemiscrotum. Varicocele:  None visualized. Pulsed Doppler interrogation of both testes demonstrates normal low resistance arterial and venous waveforms bilaterally. IMPRESSION: Unremarkable scrotal ultrasound. Specifically, no findings to suggest testicular torsion or epididymal orchitis. No testicular mass lesion. Electronically Signed   By: Kennith CenterEric  Mansell M.D.   On: 10/09/2021 08:05      Assessment/Plan Principal Problem:   Acute diverticulitis Active Problems:   ADHD   Oxygen desaturation   HTN (hypertension)   Principal Problem:   Acute diverticulitis Active Problems:   ADHD   Oxygen desaturation   HTN (hypertension)   Assessment and Plan: * Acute diverticulitis CT showed diffuse colonic diverticulosis with ill-defined wall thickening in the sigmoid colon with pericolonic edema/inflammation.  Imaging features are most suggestive of sigmoid colon diverticulitis. No evidence for perforation or abscess. Pt cannot take anything down due to nausea vomiting.  Patient does not meets criteria for sepsis -Admitted to MedSurg bed as inpatient -IV Zosyn started -Follow-up blood culture -As needed morphine and Percocet for pain -IV fluid: 1 L normal saline, then 100 cc/h -Clear diet for now, will advance diet as tolerated  HTN (hypertension) Blood pressure 132/87.  Patient likely has borderline hypertension, not taking  medications currently -IV hydralazine as needed  Oxygen desaturation Patient developed oxygen desaturation after received 2 dose of IV morphine.  Patient denies any chest pain or shortness breath currently.  Oxygen saturation 100% on 2 L oxygen currently.  Chest x-ray negative. -Supportive care -As needed albuterol -Check COVID19 PCR  ADHD Stable.  Patient not taking medications currently. -Observe closely             DVT ppx:  SQ Lovenox  Code Status: Full code  Family Communication:  Yes, patient's wife   at bed side.      Disposition Plan:  Anticipate discharge back to previous environment  Consults called:  none  Admission status and  Level of care: Med-Surg:   as inpt      Severity of Illness:  The appropriate patient status for this patient is INPATIENT. Inpatient status is judged to be reasonable and necessary in order to provide the required intensity of service to ensure the patient's safety. The patient's presenting symptoms, physical exam findings, and initial radiographic and laboratory data in the context of their chronic comorbidities is felt to place them at high risk for further clinical deterioration. Furthermore, it is not anticipated that the patient will be medically stable for discharge from the hospital within 2 midnights of admission.   * I certify that at the point of admission it is my clinical judgment that the patient will require inpatient hospital care spanning beyond 2 midnights from the point of admission due to high intensity of service, high risk for further deterioration and high frequency of surveillance required.*       Date of Service 10/09/2021    Lorretta Harp [facility]   If 7PM-7AM, please contact night-coverage [email or web address] 10/09/2021, 10:12 AM

## 2021-10-09 NOTE — ED Notes (Signed)
Breakfast tray given at this time. Pt also given a cup of water, pt is on a clear liquid diet.   Pt also given urinal and instructed to call this RN when urine sample is able to be obtained. Pt verbalized understanding.

## 2021-10-09 NOTE — ED Notes (Signed)
Lunch meal tray given at this time.  

## 2021-10-09 NOTE — ED Notes (Signed)
Report to ashley, rn

## 2021-10-09 NOTE — ED Notes (Signed)
Pt to ct scan.

## 2021-10-09 NOTE — Assessment & Plan Note (Signed)
CT showed diffuse colonic diverticulosis with ill-defined wall thickening in the sigmoid colon with pericolonic edema/inflammation. Imaging features are most suggestive of sigmoid colon diverticulitis. No evidence for perforation or abscess. Pt cannot take anything down due to nausea vomiting.  Patient does not meets criteria for sepsis.  Preliminary blood cultures negative. -Continue IV Zosyn  -As needed morphine and Percocet for pain -Continue IV fluid until he was able to take more p.o. intake -Clear diet for now, will advance diet as tolerated

## 2021-10-09 NOTE — ED Notes (Signed)
Pt requesting another dose of Toradol at this time. States that it helped with the pain but did not think he needed the morphine. Dr. Blaine Hamper, attending MD messaged at this time.

## 2021-10-09 NOTE — ED Triage Notes (Signed)
Pt arrived via POV with reports of lower abd pain and vomiting, pt states the pain starts in the lower abd and radiates towards testicles and rectum.  Pt vomited about 200cc bile in triage.

## 2021-10-09 NOTE — ED Provider Notes (Signed)
[facility]    Event Date/Time   First MD Initiated Contact with Patient 10/09/21 [phone]     (approximate)   History   Abdominal Pain and Emesis   HPI  Nathan Beck is a 31 y.o. male with history of ADHD, hypertension who presents to the emergency department with complaints of diffuse lower abdominal pain that started yesterday.  Pain radiates into both testicles and into his rectum.  He has had nausea, vomiting.  No diarrhea.  No known fevers.  No previous abdominal surgery.  States he feels like he is having a hard time urinating but no dysuria, penile discharge, scrotal swelling.   History provided by patient and significant other.    History reviewed. No pertinent past medical history.  History reviewed. No pertinent surgical history.  MEDICATIONS:  Prior to Admission medications   Medication Sig Start Date End Date Taking? Authorizing Provider  amphetamine-dextroamphetamine (ADDERALL) 20 MG tablet Take 20 mg by mouth 2 (two) times daily. 07/01/15   [provider]  ibuprofen (MOTRIN IB) 200 MG tablet Take 3 tablets (600 mg total) by mouth every 6 (six) hours as needed. 07/03/15   Sharman CheekStafford, Phillip, MD  oxyCODONE-acetaminophen (ROXICET) 5-325 MG tablet Take 1 tablet by mouth every 6 (six) hours as needed for severe pain. 07/03/15   Sharman CheekStafford, Phillip, MD    Physical Exam   Triage Vital Signs: ED Triage Vitals  Enc Vitals Group     BP 10/09/21 0530 (!) 111/93     Pulse Rate 10/09/21 0530 92     Resp 10/09/21 0530 18     Temp 10/09/21 0530 97.9 F (36.6 C)     Temp Source 10/09/21 0530 Oral     SpO2 10/09/21 0530 100 %     Weight 10/09/21 0528 200 lb (90.7 kg)     Height 10/09/21 0528 5\' 10"  (1.778 m)     Head Circumference --      Peak Flow --      Pain Score 10/09/21 0527 10     Pain Loc --      Pain Edu? --      Excl. in GC? --     Most recent vital signs: Vitals:   10/09/21 0626 10/09/21 0707  BP:  132/87   Pulse: 60 66  Resp:  18  Temp:    SpO2: 100% 100%    CONSTITUTIONAL: Alert and oriented and responds appropriately to questions.  Appears uncomfortable HEAD: Normocephalic, atraumatic EYES: Conjunctivae clear, pupils appear equal, sclera nonicteric ENT: normal nose; moist mucous membranes NECK: Supple, normal ROM CARD: RRR; S1 and S2 appreciated; no murmurs, no clicks, no rubs, no gallops RESP: Normal chest excursion without splinting or tachypnea; breath sounds clear and equal bilaterally; no wheezes, no rhonchi, no rales, no hypoxia or respiratory distress, speaking full sentences ABD/GI: Normal bowel sounds; non-distended; soft, diffusely tender throughout the lower abdomen with guarding, no rebound GU:  Normal external genitalia, circumcised male, normal penile shaft, no blood or discharge at the urethral meatus, patient has right testicular tenderness on exam without swelling, masses appreciated, no left testicular masses or tenderness on exam, no scrotal masses or swelling, no hernias appreciated, 2+ femoral pulses bilaterally; no perineal erythema, warmth, subcutaneous air or crepitus; no high riding testicle, normal bilateral cremasteric reflex.  Chaperone present for exam. BACK: The back appears normal EXT: Normal ROM in all joints; no deformity noted, no edema; no cyanosis SKIN: Normal color for age  and race; warm; no rash on exposed skin NEURO: Moves all extremities equally, normal speech PSYCH: The patient's mood and manner are appropriate.   ED Results / Procedures / Treatments   LABS: (all labs ordered are listed, but only abnormal results are displayed) Labs Reviewed  CBC WITH DIFFERENTIAL/PLATELET - Abnormal; Notable for the following components:      Result Value   WBC 10.8 (*)    Neutro Abs 8.8 (*)    All other components within normal limits  COMPREHENSIVE METABOLIC PANEL - Abnormal; Notable for the following components:   Glucose, Bld 160 (*)    Total Bilirubin  1.5 (*)    All other components within normal limits  LIPASE, BLOOD  URINALYSIS, ROUTINE W REFLEX MICROSCOPIC     EKG:   RADIOLOGY: My personal review and interpretation of imaging: CT scan shows diverticulitis.  Normal appendix.  I have personally reviewed all radiology reports.   CT ABDOMEN PELVIS W CONTRAST  Result Date: 10/09/2021 CLINICAL DATA:  Abdominal pain and vomiting. EXAM: CT ABDOMEN AND PELVIS WITH CONTRAST TECHNIQUE: Multidetector CT imaging of the abdomen and pelvis was performed using the standard protocol following bolus administration of intravenous contrast. RADIATION DOSE REDUCTION: This exam was performed according to the departmental dose-optimization program which includes automated exposure control, adjustment of the mA and/or kV according to patient size and/or use of iterative reconstruction technique. CONTRAST:  OMNIPAQUE IOHEXOL 300 MG/ML  SOLN COMPARISON:  None Available. FINDINGS: Lower chest: Unremarkable. Hepatobiliary: No suspicious focal abnormality within the liver parenchyma. There is no evidence for gallstones, gallbladder wall thickening, or pericholecystic fluid. No intrahepatic or extrahepatic biliary dilation. Pancreas: No focal mass lesion. No dilatation of the main duct. No intraparenchymal cyst. No peripancreatic edema. Spleen: No splenomegaly. No focal mass lesion. Adrenals/Urinary Tract: No adrenal nodule or mass. Kidneys unremarkable. No evidence for hydroureter. The urinary bladder appears normal for the degree of distention. Stomach/Bowel: Tiny hiatal hernia. Stomach is unremarkable. No gastric wall thickening. No evidence of outlet obstruction. No small bowel wall thickening. No small bowel dilatation. The terminal ileum is normal. The appendix is normal. Diverticular changes are seen in the colon diffusely. There is ill-defined wall thickening in the sigmoid colon with pericolonic edema/inflammation. Imaging features are most suggestive of  sigmoid colon diverticulitis. No evidence for perforation or abscess. Vascular/Lymphatic: No abdominal aortic aneurysm. No abdominal aortic atherosclerotic calcification. There is no gastrohepatic or hepatoduodenal ligament lymphadenopathy. No retroperitoneal or mesenteric lymphadenopathy. No pelvic sidewall lymphadenopathy. Reproductive: The prostate gland and seminal vesicles are unremarkable. Other: Trace free fluid noted in the pelvis. Musculoskeletal: Tiny umbilical hernia contains only fat. No worrisome lytic or sclerotic osseous abnormality. IMPRESSION: 1. Diffuse colonic diverticulosis with ill-defined wall thickening in the sigmoid colon with pericolonic edema/inflammation. Imaging features are most suggestive of sigmoid colon diverticulitis. No evidence for perforation or abscess. 2. Tiny hiatal hernia. Electronically Signed   By: Kennith Center M.D.   On: 10/09/2021 07:10     PROCEDURES:  Critical Care performed: No     Procedures    IMPRESSION / MDM / ASSESSMENT AND PLAN / ED COURSE  I reviewed the triage vital signs and the nursing notes.    Patient here with lower abdominal pain that radiates into the testicles and rectum.     DIFFERENTIAL DIAGNOSIS (includes but not limited to):   Colitis, diverticulitis, appendicitis, UTI, kidney stone, pyelonephritis, testicular torsion, orchitis, epididymitis, STD   PLAN: We will obtain CBC, CMP, lipase, urinalysis, CT of the  abdomen pelvis and ultrasound of the right testicle with Doppler.  We will give IV fluids, pain and nausea medicine.   MEDICATIONS GIVEN IN ED: Medications  morphine (PF) 4 MG/ML injection 4 mg (4 mg Intravenous Given 10/09/21 0603)  ondansetron (ZOFRAN) injection 4 mg (4 mg Intravenous Given 10/09/21 0603)  sodium chloride 0.9 % bolus 1,000 mL (1,000 mLs Intravenous New Bag/Given 10/09/21 0603)  iohexol (OMNIPAQUE) 300 MG/ML solution 100 mL (100 mLs Intravenous Contrast Given 10/09/21 0637)     ED COURSE:  Patient's labs show leukocytosis of 10.8 with left shift.  Normal LFTs, lipase, renal function.  CT of the abdomen pelvis reviewed and interpreted by myself and radiologist and shows sigmoid diverticulitis without perforation or abscess.  Urinalysis and testicular ultrasound pending.  Signed out the oncoming ED physician.   I reviewed all nursing notes and pertinent previous records as available.  I have reviewed and interpreted any and all EKGs, lab and urine results, imaging and radiology reports (as available).    CONSULTS: Admission possible given patient has severe pain with diverticulitis.   OUTSIDE RECORDS REVIEWED: Reviewed patient's last office visit with Dr. Marcello Fennel on 05/09/2017.         FINAL CLINICAL IMPRESSION(S) / ED DIAGNOSES   Final diagnoses:  Lower abdominal pain  Pain in right testicle  Sigmoid diverticulitis     Rx / DC Orders   ED Discharge Orders     None        Note:  This document was prepared using Dragon voice recognition software and may include unintentional dictation errors.   Beatrice Ziehm, Layla Maw, DO 10/09/21 [phone]

## 2021-10-09 NOTE — ED Notes (Signed)
Dr. Niu, MD at bedside at this time.  

## 2021-10-09 NOTE — ED Provider Notes (Signed)
CT ABDOMEN PELVIS W CONTRAST  Result Date: 10/09/2021 CLINICAL DATA:  Abdominal pain and vomiting. EXAM: CT ABDOMEN AND PELVIS WITH CONTRAST TECHNIQUE: Multidetector CT imaging of the abdomen and pelvis was performed using the standard protocol following bolus administration of intravenous contrast. RADIATION DOSE REDUCTION: This exam was performed according to the departmental dose-optimization program which includes automated exposure control, adjustment of the mA and/or kV according to patient size and/or use of iterative reconstruction technique. CONTRAST:  OMNIPAQUE IOHEXOL 300 MG/ML  SOLN COMPARISON:  None Available. FINDINGS: Lower chest: Unremarkable. Hepatobiliary: No suspicious focal abnormality within the liver parenchyma. There is no evidence for gallstones, gallbladder wall thickening, or pericholecystic fluid. No intrahepatic or extrahepatic biliary dilation. Pancreas: No focal mass lesion. No dilatation of the main duct. No intraparenchymal cyst. No peripancreatic edema. Spleen: No splenomegaly. No focal mass lesion. Adrenals/Urinary Tract: No adrenal nodule or mass. Kidneys unremarkable. No evidence for hydroureter. The urinary bladder appears normal for the degree of distention. Stomach/Bowel: Tiny hiatal hernia. Stomach is unremarkable. No gastric wall thickening. No evidence of outlet obstruction. No small bowel wall thickening. No small bowel dilatation. The terminal ileum is normal. The appendix is normal. Diverticular changes are seen in the colon diffusely. There is ill-defined wall thickening in the sigmoid colon with pericolonic edema/inflammation. Imaging features are most suggestive of sigmoid colon diverticulitis. No evidence for perforation or abscess. Vascular/Lymphatic: No abdominal aortic aneurysm. No abdominal aortic atherosclerotic calcification. There is no gastrohepatic or hepatoduodenal ligament lymphadenopathy. No retroperitoneal or mesenteric lymphadenopathy. No pelvic  sidewall lymphadenopathy. Reproductive: The prostate gland and seminal vesicles are unremarkable. Other: Trace free fluid noted in the pelvis. Musculoskeletal: Tiny umbilical hernia contains only fat. No worrisome lytic or sclerotic osseous abnormality. IMPRESSION: 1. Diffuse colonic diverticulosis with ill-defined wall thickening in the sigmoid colon with pericolonic edema/inflammation. Imaging features are most suggestive of sigmoid colon diverticulitis. No evidence for perforation or abscess. 2. Tiny hiatal hernia. Electronically Signed   By: Kennith Center M.D.   On: 10/09/2021 07:10   US SCROTUM W/DOPPLER  Result Date: 10/09/2021 CLINICAL DATA:  2 day history of right testicular pain. EXAM: SCROTAL ULTRASOUND DOPPLER ULTRASOUND OF THE TESTICLES TECHNIQUE: Complete ultrasound examination of the testicles, epididymis, and other scrotal structures was performed. Color and spectral Doppler ultrasound were also utilized to evaluate blood flow to the testicles. COMPARISON:  None Available. FINDINGS: Right testicle Measurements: 4.6 x 2.6 x 2.3 cm. No mass or microlithiasis visualized. Left testicle Measurements: 4.6 x 2.2 x 2.4 cm. No mass or microlithiasis visualized. Right epididymis:  Normal in size and appearance. Left epididymis:  Normal in size and appearance. Hydrocele:  Physiologic volume fluid identified in each hemiscrotum. Varicocele:  None visualized. Pulsed Doppler interrogation of both testes demonstrates normal low resistance arterial and venous waveforms bilaterally. IMPRESSION: Unremarkable scrotal ultrasound. Specifically, no findings to suggest testicular torsion or epididymal orchitis. No testicular mass lesion. Electronically Signed   By: Kennith Center M.D.   On: 10/09/2021 08:05     ----------------------------------------- 7:58 AM on 10/09/2021 ----------------------------------------- Obtained patient in signout from Dr. Elesa Massed.  Current plan is pending scrotal ultrasound, but given CT  findings and presentation suspect driving etiology is likely acute diverticulitis with referred pain.  This point patient had severe pain on arrival, still in pain and uncomfortable, elevated white count also noted.  Dr. Elesa Massed has ordered IV Zosyn, discussed with the patient and his wife given the level and degree of pain will give additional morphine and Toradol at this  time.  Anticipate admission for pain control as well as initiation of treatment for diverticulitis with IV antibiotic as ordered.  Due to the patient's severity of pain, acute diverticulitis elevated white count and presentation have consulted with the hospitalist Dr. Clyde Lundborg And patient will be admitted for further care and treatment   Sharyn Creamer, MD 10/09/21 [phone]

## 2021-10-09 NOTE — Consult Note (Addendum)
Pharmacy Antibiotic Note  Nathan Beck is a 31 y.o. male admitted on 10/09/2021 with  IAI (diverticulitis) .  Pharmacy has been consulted for Zosyn dosing.  Plan: Zosyn 3.375g IV x1 in ED (5/22 0800); followed by Zosyn 3.375g IV q8h (5/22 1600>>  Height: 5\' 10"  (177.8 cm) Weight: 90.7 kg (200 lb) IBW/kg (Calculated) : 73  Temp (24hrs), Avg:97.9 F (36.6 C), Min:97.9 F (36.6 C), Max:97.9 F (36.6 C)  Recent Labs  Lab 10/09/21 0604  WBC 10.8*  CREATININE 1.10    Estimated Creatinine Clearance: 110.2 mL/min (by C-G formula based on SCr of 1.1 mg/dL).    No Known Allergies  Antimicrobials this admission: Zosyn  (5/22 >>   Dose adjustments this admission: CTM and adjust PRN. Pt stable at [facility]  Microbiology results: 5/22 BCx: pending  Thank you for allowing pharmacy to be a part of this patient's care.  6/22 10/09/2021 8:37 AM

## 2021-10-09 NOTE — Assessment & Plan Note (Signed)
Stable.  Patient not taking medications currently. -Observe closely

## 2021-10-09 NOTE — ED Notes (Signed)
Pt able to use urinal without assistance.  

## 2021-10-09 NOTE — ED Notes (Signed)
Pt placed on oxygen at 2lpm, pt sleeping after morphine, pox down into 70s, rebounds to 98% after oxygen.

## 2021-10-09 NOTE — Assessment & Plan Note (Signed)
Blood pressure currently within goal, it was mildly elevated at the time of H&P.  Patient likely has borderline hypertension, not taking medications currently -IV hydralazine as needed

## 2021-10-10 DIAGNOSIS — K5792 Diverticulitis of intestine, part unspecified, without perforation or abscess without bleeding: Secondary | ICD-10-CM | POA: Diagnosis not present

## 2021-10-10 LAB — BASIC METABOLIC PANEL
Anion gap: 8 (ref 5–15)
BUN: 15 mg/dL (ref 6–20)
CO2: 24 mmol/L (ref 22–32)
Calcium: 8.6 mg/dL — ABNORMAL LOW (ref 8.9–10.3)
Chloride: 105 mmol/L (ref 98–111)
Creatinine, Ser: 0.98 mg/dL (ref 0.61–1.24)
GFR, Estimated: >60 mL/min (ref >60)
Glucose, Bld: 95 mg/dL (ref 70–99)
Potassium: 3.6 mmol/L (ref 3.5–5.1)
Sodium: 137 mmol/L (ref 135–145)

## 2021-10-10 LAB — CBC
HCT: 36.6 % — ABNORMAL LOW (ref 39.0–52.0)
Hemoglobin: 12.2 g/dL — ABNORMAL LOW (ref 13.0–17.0)
MCH: 31.7 pg (ref 26.0–34.0)
MCHC: 33.3 g/dL (ref 30.0–36.0)
MCV: 95.1 fL (ref 80.0–100.0)
Platelets: 173 10*3/uL (ref 150–400)
RBC: 3.85 MIL/uL — ABNORMAL LOW (ref 4.22–5.81)
RDW: 12.6 % (ref 11.5–15.5)
WBC: 10.4 10*3/uL (ref 4.0–10.5)
nRBC: 0 % (ref 0.0–0.2)

## 2021-10-10 NOTE — Progress Notes (Signed)
Progress Note   Patient: Nathan Beck [id number] DOB: 29-Jan-1991 DOA: 10/09/2021     1 DOS: the patient was seen and examined on 10/10/2021   Brief hospital course: Taken from H&P.  Nathan Beck is a 31 y.o. male with medical history significant of hypertension, ADHD, who presents with abdominal pain.   Patient states that his abdomen started yesterday, which is located in the lower abdomen, constant, initially 10 out of 10 in severity, currently 4 out of 10 in severity, sharp, radiating to the peritoneal area.  Associated with nausea and multiple episodes of nonbilious nonbloody vomiting.  Patient can not take anything down. No diarrhea.  No fever or chills.  Patient does not have chest pain, cough, shortness breath.  No symptoms of UTI.   Per report, patient had normal oxygen saturation initially, but after receiving 2 doses of morphine (2 mg and 4 mg of IV) in ED, patient had oxygen desaturation to 70s% on room air, which improved to 100% on 2 L oxygen.  Denies any shortness breath chest pain or shortness breath.   Data Reviewed and ED Course: pt was found to have WBC 10.8, lipase 24, normal liver function, GFR> 60, temperature normal, blood pressure 132/87, heart rate 90, 66, 18.  CT abdomen/pelvis that showed sigmoid diverticulitis without abscess formation.  Patient is admitted to [facility] as inpatient   US-scrotum: Unremarkable scrotal ultrasound. Specifically, no findings to suggest testicular torsion or epididymal orchitis. No testicular mass lesion.   CT-abd/pelvis 1. Diffuse colonic diverticulosis with ill-defined wall thickening in the sigmoid colon with pericolonic edema/inflammation. Imaging features are most suggestive of sigmoid colon diverticulitis. No evidence for perforation or abscess. 2. Tiny hiatal hernia.  Patient was admitted for acute diverticulitis and started on IV Zosyn.  5/23: Preliminary blood cultures remain negative in 24-hour.  Some  improvement in leukocytosis but all cell lines decreased.  Might be dilutional as patient received IV fluid. Patient continued to have some abdominal pain.  Last bowel movement was on Sunday.  Continued to have some nausea, unable to tolerate much p.o. intake except couple of sips at a time.    Assessment and Plan: * Acute diverticulitis CT showed diffuse colonic diverticulosis with ill-defined wall thickening in the sigmoid colon with pericolonic edema/inflammation. Imaging features are most suggestive of sigmoid colon diverticulitis. No evidence for perforation or abscess. Pt cannot take anything down due to nausea vomiting.  Patient does not meets criteria for sepsis.  Preliminary blood cultures negative. -Continue IV Zosyn  -As needed morphine and Percocet for pain -Continue IV fluid until he was able to take more p.o. intake -Clear diet for now, will advance diet as tolerated  Oxygen desaturation Patient developed oxygen desaturation after received 2 dose of IV morphine.  Patient denies any chest pain or shortness breath currently.  Oxygen saturation 100% on 2 L oxygen currently.  Chest x-ray negative.  COVID-19 and influenza PCR negative.  Currently on room air now -Supportive care -As needed albuterol   HTN (hypertension) Blood pressure currently within goal, it was mildly elevated at the time of H&P.  Patient likely has borderline hypertension, not taking medications currently -IV hydralazine as needed  ADHD Stable.  Patient not taking medications currently. -Observe closely   Subjective: Patient continued to have abdominal pain and nausea which got worse with any p.o. intake.  Able to tolerate couple of sips at this time.  Wife at bedside  Physical Exam: Vitals:   10/09/21 2000 10/09/21 2243  10/10/21 0501 10/10/21 0806  BP: 113/63 115/67 (!) 109/54 111/63  Pulse: (!) 53 (!) 56 62 66  Resp: (!) 21 18 18    Temp:  98.3 F (36.8 C) 98.7 F (37.1 C) 99 F (37.2 C)   TempSrc:    Oral  SpO2: 100% 98% 98% 100%  Weight:      Height:       General.  Well developed gentleman, in no acute distress. Pulmonary.  Lungs clear bilaterally, normal respiratory effort. CV.  Regular rate and rhythm, no JVD, rub or murmur. Abdomen.  Soft, mild diffuse tenderness, nondistended, BS positive. CNS.  Alert and oriented .  No focal neurologic deficit. Extremities.  No edema, no cyanosis, pulses intact and symmetrical. Psychiatry.  Judgment and insight appears normal.  Data Reviewed: Prior notes, labs and images reviewed  Family Communication: Discussed with wife at bedside  Disposition: Status is: Inpatient Remains inpatient appropriate because: Severity of illness   Planned Discharge Destination: Home  Prophylaxis.  Lovenox Time spent: 45 minutes  This record has been created using Systems analyst. Errors have been sought and corrected,but may not always be located. Such creation errors do not reflect on the standard of care.  Author: Lorella Nimrod, MD 10/10/2021 2:43 PM  For on call review [email or web address].

## 2021-10-10 NOTE — Plan of Care (Signed)
  Problem: Education: Goal: Knowledge of General Education information will improve Description Including pain rating scale, medication(s)/side effects and non-pharmacologic comfort measures Outcome: Progressing   Problem: Health Behavior/Discharge Planning: Goal: Ability to manage health-related needs will improve Outcome: Progressing   Problem: Clinical Measurements: Goal: Ability to maintain clinical measurements within normal limits will improve Outcome: Progressing Goal: Will remain free from infection Outcome: Progressing Goal: Diagnostic test results will improve Outcome: Progressing Goal: Respiratory complications will improve Outcome: Progressing Goal: Cardiovascular complication will be avoided Outcome: Progressing   Problem: Activity: Goal: Risk for activity intolerance will decrease Outcome: Progressing   Problem: Nutrition: Goal: Adequate nutrition will be maintained Outcome: Progressing   Problem: Elimination: Goal: Will not experience complications related to bowel motility Outcome: Progressing Goal: Will not experience complications related to urinary retention Outcome: Progressing   Problem: Pain Managment: Goal: General experience of comfort will improve Outcome: Progressing   Problem: Safety: Goal: Ability to remain free from injury will improve Outcome: Progressing   

## 2021-10-10 NOTE — Clinical Social Work Note (Signed)
  Transition of Care [facility]) Screening Note   Patient Details  Name: Nathan Beck Date of Birth: [birth year]   Transition of Care [facility]) CM/SW Contact:    Maree Krabbe, LCSW Phone Number:[phone] 10/10/2021, 3:48 PM    Transition of Care Department [facility]) has reviewed patient and no TOC needs have been identified at this time. We will continue to monitor patient advancement through interdisciplinary progression rounds. If new patient transition needs arise, please place a TOC consult.

## 2021-10-10 NOTE — Hospital Course (Addendum)
Taken from H&P.  Nathan Beck is a 31 y.o. male with medical history significant of hypertension, ADHD, who presents with abdominal pain.   Patient states that his abdomen started yesterday, which is located in the lower abdomen, constant, initially 10 out of 10 in severity, currently 4 out of 10 in severity, sharp, radiating to the peritoneal area.  Associated with nausea and multiple episodes of nonbilious nonbloody vomiting.  Patient can not take anything down. No diarrhea.  No fever or chills.  Patient does not have chest pain, cough, shortness breath.  No symptoms of UTI.   Per report, patient had normal oxygen saturation initially, but after receiving 2 doses of morphine (2 mg and 4 mg of IV) in ED, patient had oxygen desaturation to 70s% on room air, which improved to 100% on 2 L oxygen.  Denies any shortness breath chest pain or shortness breath.   Data Reviewed and ED Course: pt was found to have WBC 10.8, lipase 24, normal liver function, GFR> 60, temperature normal, blood pressure 132/87, heart rate 90, 66, 18.  CT abdomen/pelvis that showed sigmoid diverticulitis without abscess formation.  Patient is admitted to Med[facility] bed as inpatient   US-scrotum: Unremarkable scrotal ultrasound. Specifically, no findings to suggest testicular torsion or epididymal orchitis. No testicular mass lesion.   CT-abd/pelvis 1. Diffuse colonic diverticulosis with ill-defined wall thickening in the sigmoid colon with pericolonic edema/inflammation. Imaging features are most suggestive of sigmoid colon diverticulitis. No evidence for perforation or abscess. 2. Tiny hiatal hernia.  Patient was admitted for acute diverticulitis and started on IV Zosyn.  5/23: Preliminary blood cultures remain negative in 24-hour.  Some improvement in leukocytosis but all cell lines decreased.  Might be dilutional as patient received IV fluid. Patient continued to have some abdominal pain.  Last bowel movement was on  Sunday.  Continued to have some nausea, unable to tolerate much p.o. intake except couple of sips at a time.

## 2021-10-11 DIAGNOSIS — K5792 Diverticulitis of intestine, part unspecified, without perforation or abscess without bleeding: Secondary | ICD-10-CM | POA: Diagnosis not present

## 2021-10-11 MED ORDER — OXYCODONE-ACETAMINOPHEN 5-325 MG PO TABS: 1.0000 | ORAL_TABLET | Freq: Four times a day (QID) | ORAL | 0 refills | Status: DC | PRN

## 2021-10-11 MED ORDER — AMOXICILLIN-POT CLAVULANATE 875-125 MG PO TABS
1.0000 | ORAL_TABLET | Freq: Three times a day (TID) | ORAL | 0 refills | Status: AC
Start: 2021-10-11 — End: 2021-10-21

## 2021-10-11 NOTE — Plan of Care (Signed)
Problem: Education: Goal: Knowledge of General Education information will improve Description Including pain rating scale, medication(s)/side effects and non-pharmacologic comfort measures Outcome: Progressing   Problem: Health Behavior/Discharge Planning: Goal: Ability to manage health-related needs will improve Outcome: Progressing   Problem: Elimination: Goal: Will not experience complications related to bowel motility Outcome: Progressing Goal: Will not experience complications related to urinary retention Outcome: Progressing   Problem: Pain Managment: Goal: General experience of comfort will improve Outcome: Progressing   

## 2021-10-11 NOTE — Discharge Summary (Signed)
Nathan Beck [id number] DOB: 1990/07/13 DOA: 10/09/2021  PCP: Pcp, No  Admit date: 10/09/2021 Discharge date: 10/11/2021  Time spent: 45 minutes  Recommendations for Outpatient Follow-up:  Pcp f/u 1-2 weeks GI f/u for interval colonoscopy     Discharge Diagnoses:  Principal Problem:   Acute diverticulitis Active Problems:   Oxygen desaturation   HTN (hypertension)   ADHD   Discharge Condition: improved   Diet recommendation: regular  Filed Weights   10/09/21 0528  Weight: 90.7 kg    History of present illness:   Nathan Beck is a 31 y.o. male with medical history significant of hypertension, ADHD, who presents with abdominal pain.   Patient states that his abdomen started yesterday, which is located in the lower abdomen, constant, initially 10 out of 10 in severity, currently 4 out of 10 in severity, sharp, radiating to the peritoneal area.  Associated with nausea and multiple episodes of nonbilious nonbloody vomiting.  Patient can not take anything down. No diarrhea.  No fever or chills.  Patient does not have chest pain, cough, shortness breath.  No symptoms of UTI.   Per report, patient had normal oxygen saturation initially, but after receiving 2 doses of morphine (2 mg and 4 mg of IV) in ED, patient had oxygen desaturation to 70s% on room air, which improved to 100% on 2 L oxygen.  Denies any shortness breath chest pain or shortness breath.  Hospital Course:  Patient presented with left abdominal pain, nausea/vomiting, and scrotal pain. CT showed signs uncomplicated diverticultis. Scrotal ultrasound w/o acute pathology. Patient was treated with IVF, pain medication, IV antibiotics, and bowel rest. Symptoms improved. Diet advanced and patient tolerated. Ambulated without difficulty. Pain controlled. Will discharge to complete course of antibiotics with augmentin. GI referral for interval colonoscopy.   Procedures: none   Consultations: none  Discharge  Exam: Vitals:   10/11/21 0712 10/11/21 0855  BP: 120/68 120/73  Pulse: 62 60  Resp:  18  Temp: 97.8 F (36.6 C) 98.3 F (36.8 C)  SpO2: 100% 100%    General: NAD Cardiovascular: RRR Respiratory: CTAB Abdomen: mild tenderness LLQ  Discharge Instructions   Discharge Instructions     Ambulatory referral to Gastroenterology   Complete by: As directed    What is the reason for referral?: Colonoscopy   Diet - low sodium heart healthy   Complete by: As directed    Increase activity slowly   Complete by: As directed       Allergies as of 10/11/2021   No Known Allergies      Medication List     STOP taking these medications    amphetamine-dextroamphetamine 20 MG tablet Commonly known as: ADDERALL   ibuprofen 200 MG tablet Commonly known as: Motrin IB       TAKE these medications    amoxicillin-clavulanate 875-125 MG tablet Commonly known as: AUGMENTIN Take 1 tablet by mouth in the morning, at noon, and at bedtime for 10 days.   oxyCODONE-acetaminophen 5-325 MG tablet Commonly known as: Roxicet Take 1 tablet by mouth every 6 (six) hours as needed for severe pain.       No Known Allergies  Follow-up Information     your primary care provider Follow up.                   The results of significant diagnostics from this hospitalization (including imaging, microbiology, ancillary and laboratory) are listed below for reference.    Significant Diagnostic Studies: CT  ABDOMEN PELVIS W CONTRAST  Result Date: 10/09/2021 CLINICAL DATA:  Abdominal pain and vomiting. EXAM: CT ABDOMEN AND PELVIS WITH CONTRAST TECHNIQUE: Multidetector CT imaging of the abdomen and pelvis was performed using the standard protocol following bolus administration of intravenous contrast. RADIATION DOSE REDUCTION: This exam was performed according to the departmental dose-optimization program which includes automated exposure control, adjustment of the mA and/or kV according to  patient size and/or use of iterative reconstruction technique. CONTRAST:  OMNIPAQUE IOHEXOL 300 MG/ML  SOLN COMPARISON:  None Available. FINDINGS: Lower chest: Unremarkable. Hepatobiliary: No suspicious focal abnormality within the liver parenchyma. There is no evidence for gallstones, gallbladder wall thickening, or pericholecystic fluid. No intrahepatic or extrahepatic biliary dilation. Pancreas: No focal mass lesion. No dilatation of the main duct. No intraparenchymal cyst. No peripancreatic edema. Spleen: No splenomegaly. No focal mass lesion. Adrenals/Urinary Tract: No adrenal nodule or mass. Kidneys unremarkable. No evidence for hydroureter. The urinary bladder appears normal for the degree of distention. Stomach/Bowel: Tiny hiatal hernia. Stomach is unremarkable. No gastric wall thickening. No evidence of outlet obstruction. No small bowel wall thickening. No small bowel dilatation. The terminal ileum is normal. The appendix is normal. Diverticular changes are seen in the colon diffusely. There is ill-defined wall thickening in the sigmoid colon with pericolonic edema/inflammation. Imaging features are most suggestive of sigmoid colon diverticulitis. No evidence for perforation or abscess. Vascular/Lymphatic: No abdominal aortic aneurysm. No abdominal aortic atherosclerotic calcification. There is no gastrohepatic or hepatoduodenal ligament lymphadenopathy. No retroperitoneal or mesenteric lymphadenopathy. No pelvic sidewall lymphadenopathy. Reproductive: The prostate gland and seminal vesicles are unremarkable. Other: Trace free fluid noted in the pelvis. Musculoskeletal: Tiny umbilical hernia contains only fat. No worrisome lytic or sclerotic osseous abnormality. IMPRESSION: 1. Diffuse colonic diverticulosis with ill-defined wall thickening in the sigmoid colon with pericolonic edema/inflammation. Imaging features are most suggestive of sigmoid colon diverticulitis. No evidence for perforation or  abscess. 2. Tiny hiatal hernia. Electronically Signed   By: Kennith Center M.D.   On: 10/09/2021 07:10   DG Chest Port 1 View  Result Date: 10/09/2021 CLINICAL DATA:  Shortness of breath. EXAM: PORTABLE CHEST 1 VIEW COMPARISON:  None Available. FINDINGS: The heart size and mediastinal contours are within normal limits. Both lungs are clear. The visualized skeletal structures are unremarkable. IMPRESSION: No active disease. Electronically Signed   By: Obie Dredge M.D.   On: 10/09/2021 10:05   US SCROTUM W/DOPPLER  Result Date: 10/09/2021 CLINICAL DATA:  2 day history of right testicular pain. EXAM: SCROTAL ULTRASOUND DOPPLER ULTRASOUND OF THE TESTICLES TECHNIQUE: Complete ultrasound examination of the testicles, epididymis, and other scrotal structures was performed. Color and spectral Doppler ultrasound were also utilized to evaluate blood flow to the testicles. COMPARISON:  None Available. FINDINGS: Right testicle Measurements: 4.6 x 2.6 x 2.3 cm. No mass or microlithiasis visualized. Left testicle Measurements: 4.6 x 2.2 x 2.4 cm. No mass or microlithiasis visualized. Right epididymis:  Normal in size and appearance. Left epididymis:  Normal in size and appearance. Hydrocele:  Physiologic volume fluid identified in each hemiscrotum. Varicocele:  None visualized. Pulsed Doppler interrogation of both testes demonstrates normal low resistance arterial and venous waveforms bilaterally. IMPRESSION: Unremarkable scrotal ultrasound. Specifically, no findings to suggest testicular torsion or epididymal orchitis. No testicular mass lesion. Electronically Signed   By: Kennith Center M.D.   On: 10/09/2021 08:05    Microbiology: Recent Results (from the past 240 hour(s))  Culture, blood (Routine X 2) w Reflex to ID Panel  Status: None (Preliminary result)   Collection Time: 10/09/21  9:03 AM   Specimen: BLOOD  Result Value Ref Range Status   Specimen Description BLOOD RAC  Final   Special Requests    Final    BOTTLES DRAWN AEROBIC AND ANAEROBIC Blood Culture adequate volume   Culture   Final    NO GROWTH 2 DAYS Performed at [facility], [address], [place], [place] [address]    Report Status PENDING  Incomplete  Culture, blood (Routine X 2) w Reflex to ID Panel     Status: None (Preliminary result)   Collection Time: 10/09/21  9:03 AM   Specimen: BLOOD  Result Value Ref Range Status   Specimen Description BLOOD L WRIST  Final   Special Requests   Final    BOTTLES DRAWN AEROBIC AND ANAEROBIC Blood Culture adequate volume   Culture   Final    NO GROWTH 2 DAYS Performed at [facility], [address] [place], [place] [address]    Report Status PENDING  Incomplete  Resp Panel by RT-PCR (Flu A&B, Covid) Nasopharyngeal Swab     Status: None   Collection Time: 10/09/21  9:48 AM   Specimen: Nasopharyngeal Swab; Nasopharyngeal(NP) swabs in vial transport medium  Result Value Ref Range Status   SARS Coronavirus 2 by RT PCR NEGATIVE NEGATIVE Final    Comment: (NOTE) SARS-CoV-2 target nucleic acids are NOT DETECTED.  The SARS-CoV-2 RNA is generally detectable in upper respiratory specimens during the acute phase of infection. The lowest concentration of SARS-CoV-2 viral copies this assay can detect is 138 copies/mL. A negative result does not preclude SARS-Cov-2 infection and should not be used as the sole basis for treatment or other patient management decisions. A negative result may occur with  improper specimen collection/handling, submission of specimen other than nasopharyngeal swab, presence of viral mutation(s) within the areas targeted by this assay, and inadequate number of viral copies(<138 copies/mL). A negative result must be combined with clinical observations, patient history, and epidemiological information. The expected result is Negative.  Fact Sheet for Patients:  EntrepreneurPulse.com.au  Fact Sheet for Healthcare  Providers:  IncredibleEmployment.be  This test is no t yet approved or cleared by the [place] FDA and  has been authorized for detection and/or diagnosis of SARS-CoV-2 by FDA under an Emergency Use Authorization (EUA). This EUA will remain  in effect (meaning this test can be used) for the duration of the COVID-19 declaration under Section 564(b)(1) of the Act, 21 U.S.C.section 360bbb-3(b)(1), unless the authorization is terminated  or revoked sooner.       Influenza A by PCR NEGATIVE NEGATIVE Final   Influenza B by PCR NEGATIVE NEGATIVE Final    Comment: (NOTE) The Xpert Xpress SARS-CoV-2/FLU/RSV plus assay is intended as an aid in the diagnosis of influenza from Nasopharyngeal swab specimens and should not be used as a sole basis for treatment. Nasal washings and aspirates are unacceptable for Xpert Xpress SARS-CoV-2/FLU/RSV testing.  Fact Sheet for Patients: EntrepreneurPulse.com.au  Fact Sheet for Healthcare Providers: IncredibleEmployment.be  This test is not yet approved or cleared by the [place] FDA and has been authorized for detection and/or diagnosis of SARS-CoV-2 by FDA under an Emergency Use Authorization (EUA). This EUA will remain in effect (meaning this test can be used) for the duration of the COVID-19 declaration under Section 564(b)(1) of the Act, 21 U.S.C. section 360bbb-3(b)(1), unless the authorization is terminated or revoked.  Performed at [facility], [place]  [address], [place], [place] [address]      Labs: Basic Metabolic Panel: Recent Labs  Lab 10/09/21 0604 10/10/21 0539  NA 135 137  K 3.5 3.6  CL 102 105  CO2 22 24  GLUCOSE 160* 95  BUN 20 15  CREATININE 1.10 0.98  CALCIUM 9.1 8.6*   Liver Function Tests: Recent Labs  Lab 10/09/21 0604  AST 26  ALT 13  ALKPHOS 54  BILITOT 1.5*  PROT 7.7  ALBUMIN 4.1   Recent Labs  Lab 10/09/21 0604  LIPASE 24    No results for input(s): AMMONIA in the last 168 hours. CBC: Recent Labs  Lab 10/09/21 0604 10/10/21 0539  WBC 10.8* 10.4  NEUTROABS 8.8*  --   HGB 13.7 12.2*  HCT 40.9 36.6*  MCV 93.2 95.1  PLT 240 173   Cardiac Enzymes: No results for input(s): CKTOTAL, CKMB, CKMBINDEX, TROPONINI in the last 168 hours. BNP: BNP (last 3 results) No results for input(s): BNP in the last 8760 hours.  ProBNP (last 3 results) No results for input(s): PROBNP in the last 8760 hours.  CBG: No results for input(s): GLUCAP in the last 168 hours.     Signed:  Desma Maxim MD.  [facility] 10/11/2021, 12:15 PM

## 2021-10-11 NOTE — Progress Notes (Signed)
Discharge instructions reviewed with patient and wife at the bedside. Both state that they do not have any further questions or concerns at this time. PIV removed with no complications. Patient is safe and stable to leave at this time. Patient declines need for wheel chair to exit.

## 2021-10-14 LAB — CULTURE, BLOOD (ROUTINE X 2)
Culture: NO GROWTH
Culture: NO GROWTH
Special Requests: ADEQUATE
Special Requests: ADEQUATE

## 2022-02-05 ENCOUNTER — Ambulatory Visit: Payer: BC Managed Care – PPO | Admitting: Gastroenterology

## 2022-02-05 NOTE — Progress Notes (Deleted)
    Gastroenterology Consultation  Referring Provider:     No ref. provider found Primary Care Physician:  Pcp, No Primary Gastroenterologist:  Dr. Allen Norris     Reason for Consultation:     Diverticulitis        HPI:   Nathan Beck is a 31 y.o. y/o male referred for consultation & management of diverticulitis by Dr. Merryl Hacker, No.  This patient comes in today after being seen in the emergency department back in May with abdominal pain and had a CT scan of the abdomen that showed:  IMPRESSION: 1. Diffuse colonic diverticulosis with ill-defined wall thickening in the sigmoid colon with pericolonic edema/inflammation. Imaging features are most suggestive of sigmoid colon diverticulitis. No evidence for perforation or abscess. 2. Tiny hiatal hernia.  At that time the patient was recommended to follow-up with GI but has not.  The patient was seen by a primary care doctor who he establish care with earlier this month.  Past Medical History:  Diagnosis Date   ADHD    HTN (hypertension)     Past Surgical History:  Procedure Laterality Date   dental procedure      Prior to Admission medications   Medication Sig Start Date End Date Taking? Authorizing Provider  oxyCODONE-acetaminophen (ROXICET) 5-325 MG tablet Take 1 tablet by mouth every 6 (six) hours as needed for severe pain. 10/11/21   Wouk, Ailene Rud, MD    Family History  Problem Relation Age of Onset   Hypertension Mother    Diverticulitis Mother    Hypertension Father      Social History   Tobacco Use   Smoking status: Former   Smokeless tobacco: Never  Substance Use Topics   Alcohol use: Not Currently   Drug use: Never    Allergies as of 02/05/2022   (No Known Allergies)    Review of Systems:    All systems reviewed and negative except where noted in HPI.   Physical Exam:  There were no vitals taken for this visit. No LMP for male patient. General:   Alert,  Well-developed, well-nourished, pleasant and  cooperative in NAD Head:  Normocephalic and atraumatic. Eyes:  Sclera clear, no icterus.   Conjunctiva pink. Ears:  Normal auditory acuity. Neck:  Supple; no masses or thyromegaly. Lungs:  Respirations even and unlabored.  Clear throughout to auscultation.   No wheezes, crackles, or rhonchi. No acute distress. Heart:  Regular rate and rhythm; no murmurs, clicks, rubs, or gallops. Abdomen:  Normal bowel sounds.  No bruits.  Soft, non-tender and non-distended without masses, hepatosplenomegaly or hernias noted.  No guarding or rebound tenderness.  Negative Carnett sign.   Rectal:  Deferred.  Pulses:  Normal pulses noted. Extremities:  No clubbing or edema.  No cyanosis. Neurologic:  Alert and oriented x3;  grossly normal neurologically. Skin:  Intact without significant lesions or rashes.  No jaundice. Lymph Nodes:  No significant cervical adenopathy. Psych:  Alert and cooperative. Normal mood and affect.  Imaging Studies: No results found.  Assessment and Plan:   Nathan Beck is a 31 y.o. y/o male ***    Lucilla Lame, MD. Marval Regal    Note: This dictation was prepared with Dragon dictation along with smaller phrase technology. Any transcriptional errors that result from this process are unintentional.

## 2022-03-06 ENCOUNTER — Ambulatory Visit: Payer: BC Managed Care – PPO | Admitting: Gastroenterology

## 2022-03-06 ENCOUNTER — Encounter: Payer: Self-pay | Admitting: Gastroenterology

## 2022-03-06 VITALS — BP 137/86 | HR 71 | Temp 98.7°F | Ht 70.0 in | Wt 214.0 lb

## 2022-03-06 DIAGNOSIS — K5792 Diverticulitis of intestine, part unspecified, without perforation or abscess without bleeding: Secondary | ICD-10-CM | POA: Diagnosis not present

## 2022-03-06 MED ORDER — NA SULFATE-K SULFATE-MG SULF 17.5-3.13-1.6 GM/177ML PO SOLN: 1.0000 | Freq: Once | ORAL | 0 refills | Status: AC

## 2022-03-06 NOTE — Progress Notes (Signed)
Gastroenterology Consultation  Referring Provider:     Dr. Si Raider Primary Care Physician:  Pcp, No Primary Gastroenterologist:  Dr. Allen Norris     Reason for Consultation:     Diverticulitis        HPI:   Nathan Beck is a 31 y.o. y/o male referred for consultation & management of diverticulitis by Dr. Merryl Hacker, No.  This patient comes in today with a history of being in the emergency department back in May.  The patient had a CT scan at that time that showed:  IMPRESSION: 1. Diffuse colonic diverticulosis with ill-defined wall thickening in the sigmoid colon with pericolonic edema/inflammation. Imaging features are most suggestive of sigmoid colon diverticulitis. No evidence for perforation or abscess. 2. Tiny hiatal hernia.  The patient denies any previous episodes of diverticulitis.  He reports that his pain started almost abruptly and was throughout his entire abdomen and his lower pelvic region.  There is no report of any unexplained weight loss black stools or bloody stools.  The patient also denies any family history of colon cancer colon polyps.  The diverticulosis was not associated with any rectal bleeding.  Past Medical History:  Diagnosis Date   ADHD    HTN (hypertension)     Past Surgical History:  Procedure Laterality Date   dental procedure      Prior to Admission medications   Medication Sig Start Date End Date Taking? Authorizing Provider  oxyCODONE-acetaminophen (ROXICET) 5-325 MG tablet Take 1 tablet by mouth every 6 (six) hours as needed for severe pain. 10/11/21   Wouk, Ailene Rud, MD    Family History  Problem Relation Age of Onset   Hypertension Mother    Diverticulitis Mother    Hypertension Father      Social History   Tobacco Use   Smoking status: Former   Smokeless tobacco: Never  Substance Use Topics   Alcohol use: Not Currently   Drug use: Never    Allergies as of 03/06/2022   (No Known Allergies)    Review of Systems:    All systems  reviewed and negative except where noted in HPI.   Physical Exam:  There were no vitals taken for this visit. No LMP for male patient. General:   Alert,  Well-developed, well-nourished, pleasant and cooperative in NAD Head:  Normocephalic and atraumatic. Eyes:  Sclera clear, no icterus.   Conjunctiva pink. Ears:  Normal auditory acuity. Neck:  Supple; no masses or thyromegaly. Lungs:  Respirations even and unlabored.  Clear throughout to auscultation.   No wheezes, crackles, or rhonchi. No acute distress. Heart:  Regular rate and rhythm; no murmurs, clicks, rubs, or gallops. Abdomen:  Normal bowel sounds.  No bruits.  Soft, non-tender and non-distended without masses, hepatosplenomegaly or hernias noted.  No guarding or rebound tenderness.  Negative Carnett sign.   Rectal:  Deferred.  Pulses:  Normal pulses noted. Extremities:  No clubbing or edema.  No cyanosis. Neurologic:  Alert and oriented x3;  grossly normal neurologically. Skin:  Intact without significant lesions or rashes.  No jaundice. Lymph Nodes:  No significant cervical adenopathy. Psych:  Alert and cooperative. Normal mood and affect.  Imaging Studies: No results found.  Assessment and Plan:   Nathan Beck is a 31 y.o. y/o male who comes in today with a bout of diverticulitis proven by CT scan.  The patient will be set up for colonoscopy due to his recent attack of diverticulitis.  The patient has been  explained the plan and agrees with it.    Lucilla Lame, MD. Marval Regal    Note: This dictation was prepared with Dragon dictation along with smaller phrase technology. Any transcriptional errors that result from this process are unintentional.

## 2022-03-13 ENCOUNTER — Ambulatory Visit: Payer: BC Managed Care – PPO | Admitting: Gastroenterology

## 2022-03-29 MED ORDER — NA SULFATE-K SULFATE-MG SULF 17.5-3.13-1.6 GM/177ML PO SOLN: 1.0000 | Freq: Once | ORAL | 0 refills | Status: AC

## 2022-03-29 NOTE — Addendum Note (Signed)
Addended by: Roena Malady on: 03/29/2022 11:17 AM   Modules accepted: Orders

## 2022-04-02 ENCOUNTER — Telehealth: Payer: Self-pay

## 2022-04-02 NOTE — Telephone Encounter (Signed)
Patient lvm to inquire if he had to have all of his out of pocket estimate on the day of procedure.  Returned patients call back.  Informed him that he does not have to have all of this up front as long as he could pay something on it-he would be billed for the rest and can make payment arrangements.  Thanks,  [place], [place]

## 2022-04-03 ENCOUNTER — Ambulatory Visit: Payer: BC Managed Care – PPO | Admitting: Certified Registered"

## 2022-04-03 ENCOUNTER — Encounter: Payer: Self-pay | Admitting: Gastroenterology

## 2022-04-03 ENCOUNTER — Ambulatory Visit
Admission: RE | Admit: 2022-04-03 | Discharge: 2022-04-03 | Disposition: A | Discharge status: Home / Self Care | Payer: BC Managed Care – PPO | Attending: Gastroenterology | Admitting: Gastroenterology

## 2022-04-03 ENCOUNTER — Encounter
Admission: RE | Disposition: A | Discharge status: Home / Self Care | Payer: Self-pay | Source: Home / Self Care | Attending: Gastroenterology

## 2022-04-03 DIAGNOSIS — F909 Attention-deficit hyperactivity disorder, unspecified type: Secondary | ICD-10-CM | POA: Diagnosis not present

## 2022-04-03 DIAGNOSIS — K5732 Diverticulitis of large intestine without perforation or abscess without bleeding: Secondary | ICD-10-CM | POA: Diagnosis not present

## 2022-04-03 DIAGNOSIS — K5730 Diverticulosis of large intestine without perforation or abscess without bleeding: Secondary | ICD-10-CM | POA: Diagnosis not present

## 2022-04-03 DIAGNOSIS — Z09 Encounter for follow-up examination after completed treatment for conditions other than malignant neoplasm: Secondary | ICD-10-CM | POA: Diagnosis present

## 2022-04-03 DIAGNOSIS — Z87891 Personal history of nicotine dependence: Secondary | ICD-10-CM | POA: Insufficient documentation

## 2022-04-03 DIAGNOSIS — Z8719 Personal history of other diseases of the digestive system: Secondary | ICD-10-CM | POA: Diagnosis not present

## 2022-04-03 DIAGNOSIS — K5792 Diverticulitis of intestine, part unspecified, without perforation or abscess without bleeding: Secondary | ICD-10-CM

## 2022-04-03 HISTORY — PX: COLONOSCOPY WITH PROPOFOL: SHX5780

## 2022-04-03 SURGERY — COLONOSCOPY WITH PROPOFOL
Anesthesia: General

## 2022-04-03 MED ORDER — PROPOFOL 500 MG/50ML IV EMUL
INTRAVENOUS | Status: DC | PRN
  Administered 2022-04-03: 150 ug/kg/min via INTRAVENOUS

## 2022-04-03 MED ORDER — PROPOFOL 10 MG/ML IV BOLUS
INTRAVENOUS | Status: DC | PRN
  Administered 2022-04-03: 70 mg via INTRAVENOUS
  Administered 2022-04-03: 30 mg via INTRAVENOUS
  Administered 2022-04-03: 40 mg via INTRAVENOUS

## 2022-04-03 MED ORDER — MIDAZOLAM HCL 2 MG/2ML IJ SOLN
INTRAMUSCULAR | Status: DC | PRN
  Administered 2022-04-03 (×2): 2 mg via INTRAVENOUS

## 2022-04-03 MED ORDER — MIDAZOLAM HCL 2 MG/2ML IJ SOLN
INTRAMUSCULAR | Status: AC
  Filled 2022-04-03: qty 2

## 2022-04-03 MED ORDER — DEXMEDETOMIDINE HCL IN NACL 80 MCG/20ML IV SOLN
INTRAVENOUS | Status: AC
  Filled 2022-04-03: qty 20

## 2022-04-03 MED ORDER — PROPOFOL 1000 MG/100ML IV EMUL
INTRAVENOUS | Status: AC
  Filled 2022-04-03: qty 100

## 2022-04-03 MED ORDER — SODIUM CHLORIDE 0.9 % IV SOLN: INTRAVENOUS | Status: DC

## 2022-04-03 NOTE — Transfer of Care (Signed)
Immediate Anesthesia Transfer of Care Note  Patient: Nathan Beck  Procedure(s) Performed: COLONOSCOPY WITH PROPOFOL  Patient Location: PACU and Endoscopy Unit  Anesthesia Type:General  Level of Consciousness: drowsy  Airway & Oxygen Therapy: Patient Spontanous Breathing  Post-op Assessment: Report given to RN and Post -op Vital signs reviewed and stable  Post vital signs: Reviewed and stable  Last Vitals:  Vitals Value Taken Time  BP 100/63 04/03/22 0950  Temp    Pulse 80 04/03/22 0951  Resp 20 04/03/22 0951  SpO2 99 % 04/03/22 0951  Vitals shown include unvalidated device data.  Last Pain:  Vitals:   04/03/22 0917  TempSrc: Temporal  PainSc: 0-No pain         Complications: No notable events documented.

## 2022-04-03 NOTE — H&P (Signed)
   Midge Minium, MD [facility] [address] [place], [place] [address] Phone:[phone] Fax : [phone]  Primary Care Physician:  Pcp, No Primary Gastroenterologist:  Dr. Servando Snare  Pre-Procedure History & Physical: HPI:  Nathan Beck is a 31 y.o. male is here for an colonoscopy.   Past Medical History:  Diagnosis Date   ADHD    HTN (hypertension)     Past Surgical History:  Procedure Laterality Date   dental procedure      Prior to Admission medications   Medication Sig Start Date End Date Taking? Authorizing Provider  amphetamine-dextroamphetamine (ADDERALL) 20 MG tablet Take 20 mg by mouth daily.   Yes [provider]    Allergies as of 03/07/2022   (No Known Allergies)    Family History  Problem Relation Age of Onset   Hypertension Mother    Diverticulitis Mother    Hypertension Father     Social History   Socioeconomic History   Marital status: Married    Spouse name: Not on file   Number of children: Not on file   Years of education: Not on file   Highest education level: Not on file  Occupational History   Not on file  Tobacco Use   Smoking status: Former   Smokeless tobacco: Never  Substance and Sexual Activity   Alcohol use: Not Currently   Drug use: Never   Sexual activity: Not on file  Other Topics Concern   Not on file  Social History Narrative   Not on file   Social Determinants of Health   Financial Resource Strain: Not on file  Food Insecurity: Not on file  Transportation Needs: Not on file  Physical Activity: Not on file  Stress: Not on file  Social Connections: Not on file  Intimate Partner Violence: Not on file    Review of Systems: See HPI, otherwise negative ROS  Physical Exam: There were no vitals taken for this visit. General:   Alert,  pleasant and cooperative in NAD Head:  Normocephalic and atraumatic. Neck:  Supple; no masses or thyromegaly. Lungs:  Clear throughout to auscultation.    Heart:   Regular rate and rhythm. Abdomen:  Soft, nontender and nondistended. Normal bowel sounds, without guarding, and without rebound.   Neurologic:  Alert and  oriented x4;  grossly normal neurologically.  Impression/Plan: Nathan Beck is here for an colonoscopy to be performed for diverticulitis  Risks, benefits, limitations, and alternatives regarding  colonoscopy have been reviewed with the patient.  Questions have been answered.  All parties agreeable.   Midge Minium, MD  04/03/2022, 9:12 AM

## 2022-04-03 NOTE — Anesthesia Procedure Notes (Signed)
Procedure Name: MAC Date/Time: 04/03/2022 9:06 AM  Performed by: Tollie Eth, CRNAPre-anesthesia Checklist: Patient identified, Emergency Drugs available, Suction available and Patient being monitored Patient Re-evaluated:Patient Re-evaluated prior to induction Oxygen Delivery Method: Nasal cannula Induction Type: IV induction Placement Confirmation: positive ETCO2

## 2022-04-03 NOTE — Op Note (Signed)
[facility] Patient Name: Nathan Beck Procedure Date: 04/03/2022 9:23 AM MRN: [id number] Account #: [id number] Date of Birth: 1991/02/15 Admit Type: Outpatient Age: 31 Room: [facility] ENDO ROOM 4 Gender: Male Note Status: Finalized Instrument Name: Colonscope [id number] Procedure:             Colonoscopy Indications:           Follow-up of diverticulitis Providers:             Midge Minium MD, MD Referring MD:          No Local Md, MD (Referring MD) Medicines:             Propofol per Anesthesia Complications:         No immediate complications. Procedure:             Pre-Anesthesia Assessment:                        - Prior to the procedure, a History and Physical was                         performed, and patient medications and allergies were                         reviewed. The patient's tolerance of previous                         anesthesia was also reviewed. The risks and benefits                         of the procedure and the sedation options and risks                         were discussed with the patient. All questions were                         answered, and informed consent was obtained. Prior                         Anticoagulants: The patient has taken no anticoagulant                         or antiplatelet agents. ASA Grade Assessment: II - A                         patient with mild systemic disease. After reviewing                         the risks and benefits, the patient was deemed in                         satisfactory condition to undergo the procedure.                        After obtaining informed consent, the colonoscope was                         passed under direct vision. Throughout the procedure,  the patient's blood pressure, pulse, and oxygen                         saturations were monitored continuously. The                         Colonoscope was introduced through the anus and                          advanced to the the cecum, identified by appendiceal                         orifice and ileocecal valve. The colonoscopy was                         performed without difficulty. The patient tolerated                         the procedure well. The quality of the bowel                         preparation was excellent. Findings:      The perianal and digital rectal examinations were normal.      Multiple small-mouthed diverticula were found in the entire colon.      The exam was otherwise without abnormality. Impression:            - Diverticulosis in the entire examined colon.                        - The examination was otherwise normal.                        - No specimens collected. Recommendation:        - Discharge patient to home.                        - Resume previous diet.                        - Continue present medications. Procedure Code(s):     --- Professional ---                        [phone], Colonoscopy, flexible; diagnostic, including                         collection of specimen(s) by brushing or washing, when                         performed (separate procedure) Diagnosis Code(s):     --- Professional ---                        W23.76, Diverticulitis of large intestine without                         perforation or abscess without bleeding CPT copyright 2022 American Medical Association. All rights reserved. The codes documented in this report are preliminary and upon coder review may  be revised to meet current compliance requirements. Midge Minium MD, MD 04/03/2022 9:49:11  AM This report has been signed electronically. Number of Addenda: 0 Note Initiated On: 04/03/2022 9:23 AM Scope Withdrawal Time: 0 hours 7 minutes 8 seconds  Total Procedure Duration: 0 hours 12 minutes 59 seconds  Estimated Blood Loss:  Estimated blood loss: none.      [employer]

## 2022-04-03 NOTE — Anesthesia Preprocedure Evaluation (Signed)
Anesthesia Evaluation  Patient identified by MRN, date of birth, ID band Patient awake    Reviewed: Allergy & Precautions, NPO status , Patient's Chart, lab work & pertinent test results  History of Anesthesia Complications Negative for: history of anesthetic complications  Airway Mallampati: II  TM Distance: >3 FB Neck ROM: Full    Dental no notable dental hx. (+) Teeth Intact   Pulmonary neg pulmonary ROS, neg sleep apnea, neg COPD, Patient abstained from smoking.Not current smoker, former smoker   Pulmonary exam normal breath sounds clear to auscultation       Cardiovascular Exercise Tolerance: Good METShypertension, (-) CAD and (-) Past MI (-) dysrhythmias  Rhythm:Regular Rate:Normal - Systolic murmurs    Neuro/Psych negative neurological ROS  negative psych ROS   GI/Hepatic ,neg GERD  ,,(+)     (-) substance abuse    Endo/Other  neg diabetes    Renal/GU negative Renal ROS     Musculoskeletal   Abdominal   Peds  Hematology   Anesthesia Other Findings Past Medical History: No date: ADHD No date: HTN (hypertension)  Reproductive/Obstetrics                             Anesthesia Physical Anesthesia Plan  ASA: 2  Anesthesia Plan: General   Post-op Pain Management: Minimal or no pain anticipated   Induction: Intravenous  PONV Risk Score and Plan: 3 and Propofol infusion, TIVA and Ondansetron  Airway Management Planned: Nasal Cannula  Additional Equipment: None  Intra-op Plan:   Post-operative Plan:   Informed Consent: I have reviewed the patients History and Physical, chart, labs and discussed the procedure including the risks, benefits and alternatives for the proposed anesthesia with the patient or authorized representative who has indicated his/her understanding and acceptance.     Dental advisory given  Plan Discussed with: CRNA and Surgeon  Anesthesia Plan  Comments: (Discussed risks of anesthesia with patient, including possibility of difficulty with spontaneous ventilation under anesthesia necessitating airway intervention, PONV, and rare risks such as cardiac or respiratory or neurological events, and allergic reactions. Discussed the role of CRNA in patient's perioperative care. Patient understands.)       Anesthesia Quick Evaluation

## 2022-04-03 NOTE — Anesthesia Postprocedure Evaluation (Signed)
Anesthesia Post Note  Patient: Nathan Beck  Procedure(s) Performed: COLONOSCOPY WITH PROPOFOL  Patient location during evaluation: Endoscopy Anesthesia Type: General Level of consciousness: awake and alert Pain management: pain level controlled Vital Signs Assessment: post-procedure vital signs reviewed and stable Respiratory status: spontaneous breathing, nonlabored ventilation, respiratory function stable and patient connected to nasal cannula oxygen Cardiovascular status: blood pressure returned to baseline and stable Postop Assessment: no apparent nausea or vomiting Anesthetic complications: no   No notable events documented.   Last Vitals:  Vitals:   04/03/22 1010 04/03/22 1020  BP: 105/63 106/66  Pulse: (!) 49 (!) 47  Resp: 20 20  Temp:    SpO2: 99% 99%    Last Pain:  Vitals:   04/03/22 1020  TempSrc:   PainSc: 0-No pain                 Corinda Gubler

## 2022-04-04 ENCOUNTER — Encounter: Payer: Self-pay | Admitting: Gastroenterology

## 2022-11-20 IMAGING — US US SCROTUM W/ DOPPLER COMPLETE
1 series · 14 of 25 positions shown · non-contrast
Comparison: None Available.

CLINICAL DATA: 2 day history of right testicular pain.

EXAM:
SCROTAL ULTRASOUND
DOPPLER ULTRASOUND OF THE TESTICLES
TECHNIQUE: Complete ultrasound examination of the testicles, epididymis, and
other scrotal structures was performed. Color and spectral Doppler
ultrasound were also utilized to evaluate blood flow to the
testicles.

[Series 1: us scrotum w/doppler · 14 of 58 slices shown]
[im 1/58]
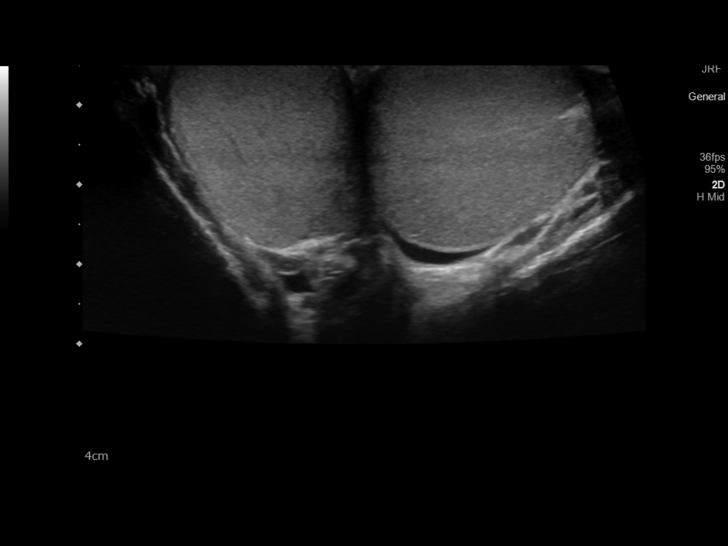
[im 5/58]
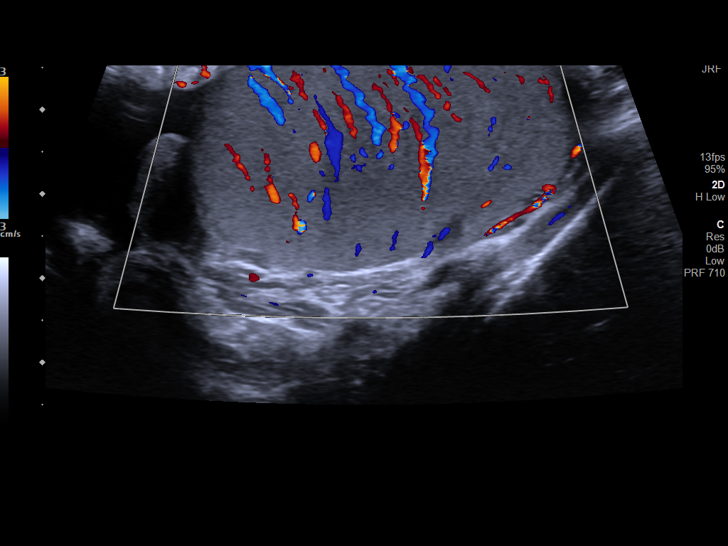
[im 10/58]
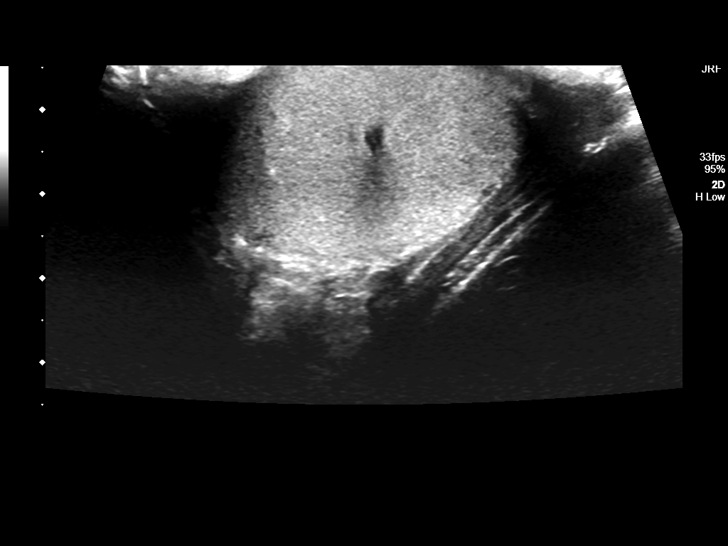
[im 15/58]
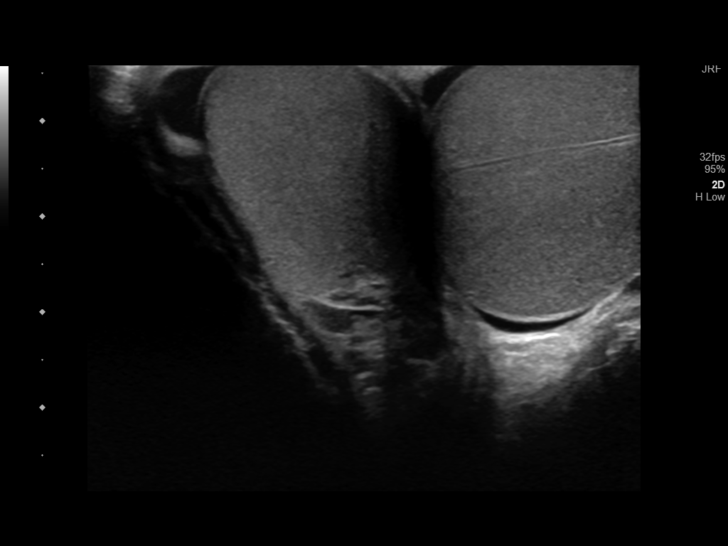
[im 20/58]
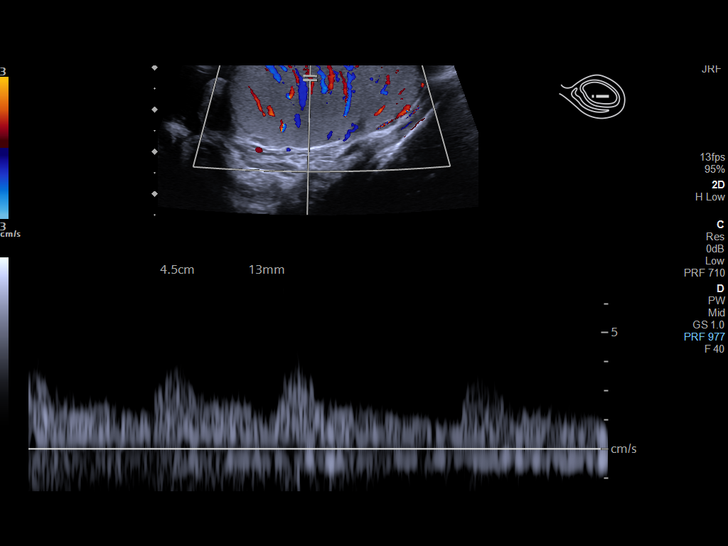
[im 22/58]
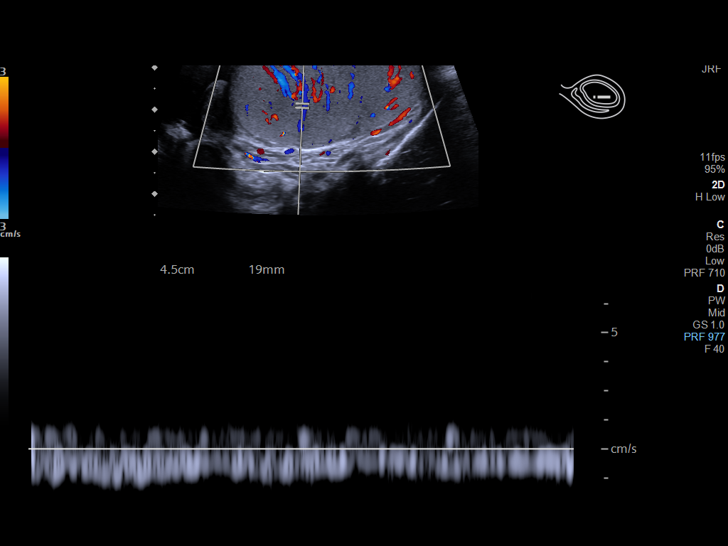
[im 27/58]
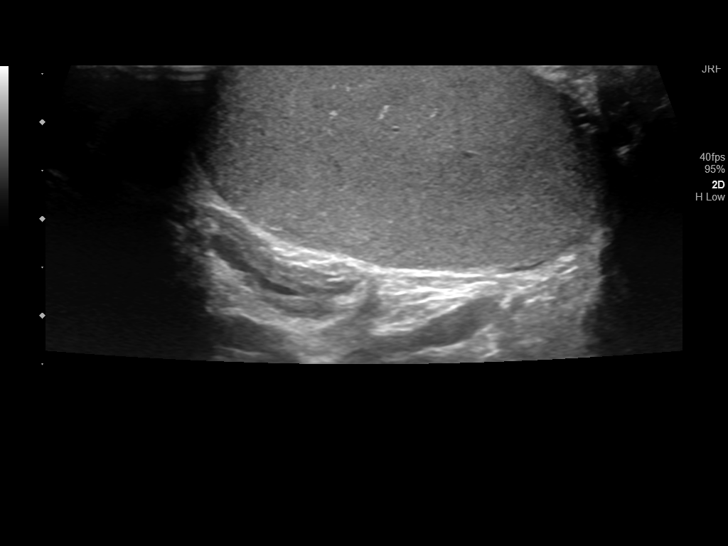
[im 31/58]
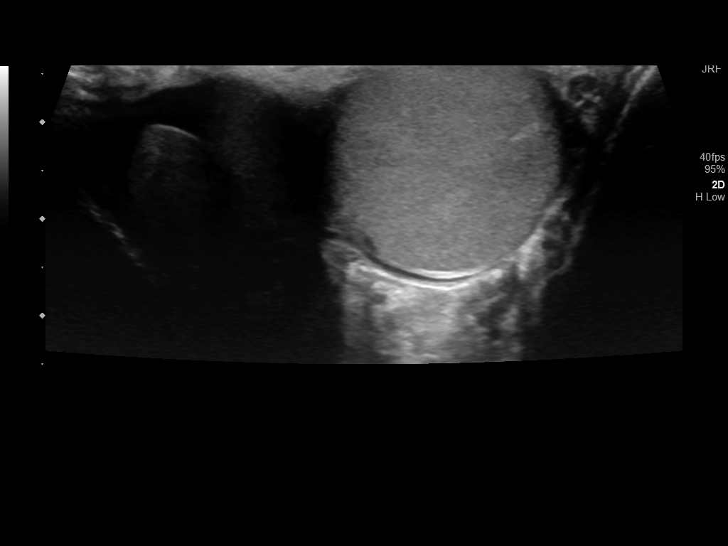
[im 36/58]
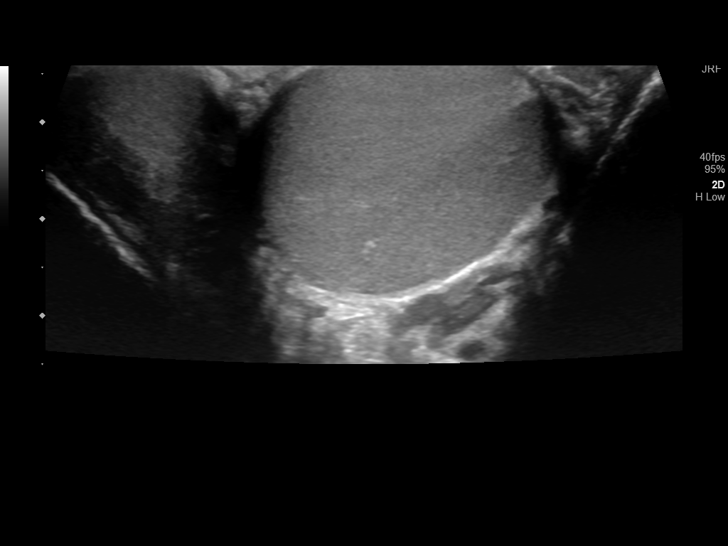
[im 39/58]
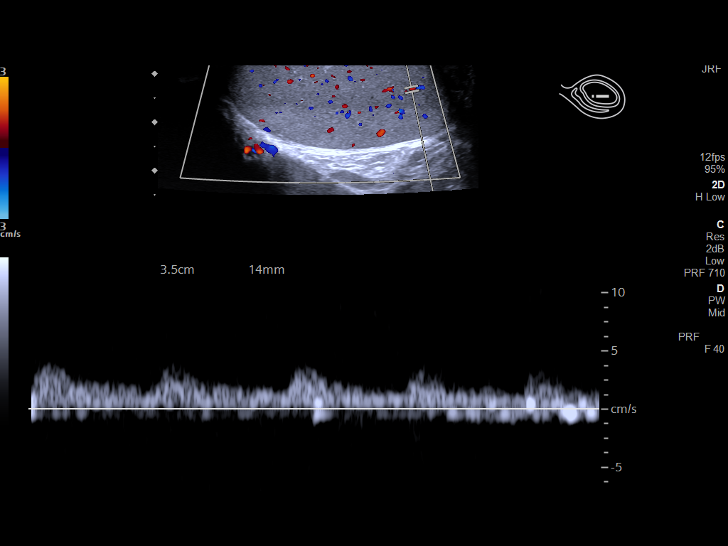
[im 43/58]
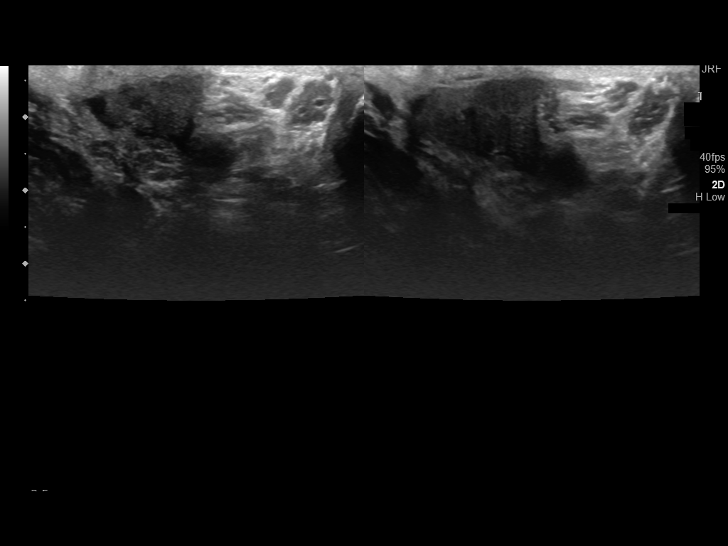
[im 48/58]
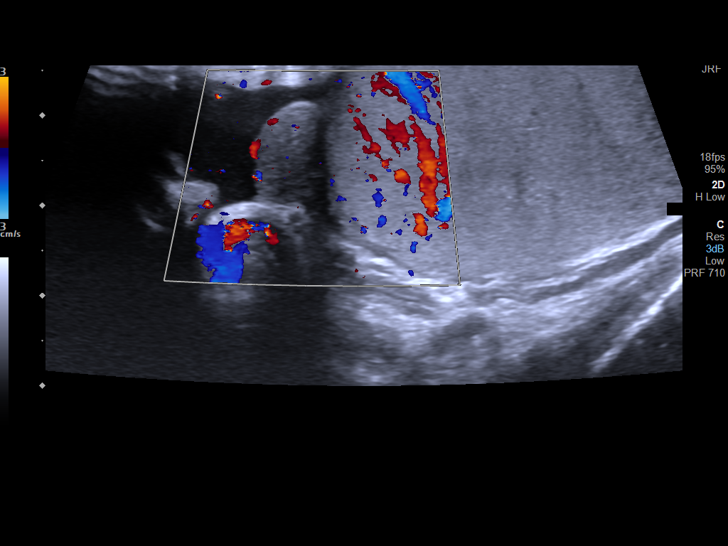
[im 53/58]
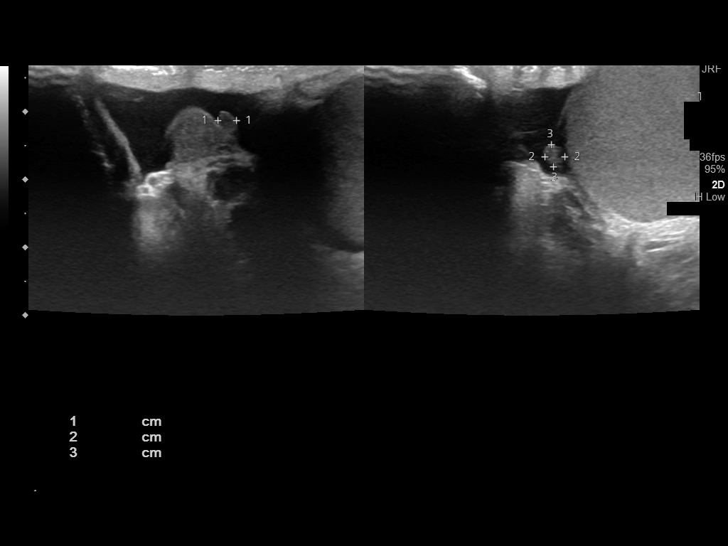
[im 58/58]
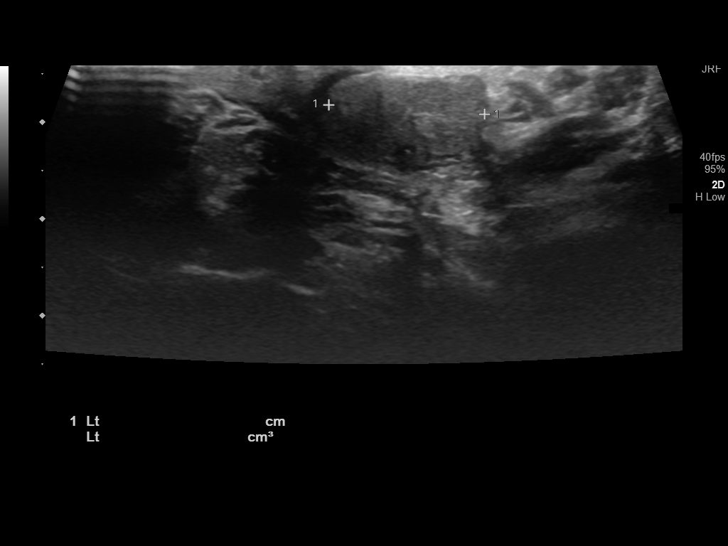

[14 of 25 positions shown; findings below may reference images not displayed]

FINDINGS: Right testicle

Measurements: 4.6 x 2.6 x 2.3 cm. No mass or microlithiasis
visualized.

Left testicle

Measurements: 4.6 x 2.2 x 2.4 cm. No mass or microlithiasis
visualized.

Right epididymis:  Normal in size and appearance.

Left epididymis:  Normal in size and appearance.

Hydrocele:  Physiologic volume fluid identified in each hemiscrotum.

Varicocele:  None visualized.

Pulsed Doppler interrogation of both testes demonstrates normal low
resistance arterial and venous waveforms bilaterally.
IMPRESSION: Unremarkable scrotal ultrasound. Specifically, no findings to
suggest testicular torsion or epididymal orchitis. No testicular
mass lesion.

## 2023-04-03 ENCOUNTER — Ambulatory Visit: Payer: BC Managed Care – PPO | Admitting: Urology

## 2023-04-03 ENCOUNTER — Encounter: Payer: Self-pay | Admitting: Urology

## 2023-04-03 VITALS — BP 118/82 | HR 67 | Ht 70.0 in | Wt 218.0 lb

## 2023-04-03 DIAGNOSIS — R3121 Asymptomatic microscopic hematuria: Secondary | ICD-10-CM

## 2023-04-03 NOTE — Progress Notes (Signed)
   04/03/23 1:26 PM   Rolly Salter 04-12-1991 [id number]  CC: asymptomatic microscopic hematuria  HPI: Healthy 32 year old male referred for microscopic hematuria with a RBC on recent urinalysis.  He also had an episode of microscopic hematuria with 6-10 RBC in May 2023.  He had a CT scan at that time that showed diverticulitis, but no urologic abnormalities.  He denies any urinary symptoms or gross hematuria.  He reports a family history of prostate cancer in his grandfather.   PMH: Past Medical History:  Diagnosis Date   ADHD    HTN (hypertension)     Surgical History: Past Surgical History:  Procedure Laterality Date   COLONOSCOPY WITH PROPOFOL N/A 04/03/2022   Procedure: COLONOSCOPY WITH PROPOFOL;  Surgeon: Midge Minium, MD;  Location: [facility] ENDOSCOPY;  Service: Endoscopy;  Laterality: N/A;   dental procedure       Family History: Family History  Problem Relation Age of Onset   Hypertension Mother    Diverticulitis Mother    Hypertension Father     Social History:  reports that he has quit smoking. He has been exposed to tobacco smoke. He has never used smokeless tobacco. He reports that he does not currently use alcohol. He reports that he does not use drugs.  Physical Exam: BP 118/82   Pulse 67   Ht 5\' 10"  (1.778 m)   Wt 218 lb (98.9 kg)   BMI 31.28 kg/m    Constitutional:  Alert and oriented, No acute distress. Cardiovascular: No clubbing, cyanosis, or edema. Respiratory: Normal respiratory effort, no increased work of breathing. GI: Abdomen is soft, nontender, nondistended, no abdominal masses  Laboratory Data: Reviewed, see HPI  Pertinent Imaging: I have personally viewed and interpreted the CT scan from May 2023 showing diverticulitis, no urologic abnormalities.  Assessment & Plan:   32 year old male with intermittent microscopic hematuria over the last 2 years, CT from May 2023 showed diverticulitis but benign from a urologic perspective.  He  denies any urinary symptoms or gross hematuria.  We discussed common possible etiologies of microscopic hematuria including idiopathic, urolithiasis, medical renal disease, and malignancy. We discussed the new asymptomatic microscopic hematuria guidelines and risk categories of low, intermediate, and high risk that are based on age, risk factors like smoking, and degree of microscopic hematuria. We discussed work-up can range from repeat urinalysis, renal ultrasound and cystoscopy, to CT urogram and cystoscopy.  They fall into the low risk category, and I recommended considering renal/bladder ultrasound and cystoscopy per the guideline recommendations.  He defers further evaluation at this time with his history of normal CT last year.  Return precautions were discussed extensively.  We also reviewed the AUA guidelines regarding PSA screening, and I recommended starting PSA screening at age 64.   Legrand Rams, MD 04/03/2023  [facility] [address], [address] [place], [place] [address] [phone]

## 2023-04-03 NOTE — Patient Instructions (Signed)
Prostate Cancer Screening  Prostate cancer screening is testing that is done to check for the presence of prostate cancer in men. The prostate gland is a walnut-sized gland that is located below the bladder and in front of the rectum in males. The function of the prostate is to add fluid to semen during ejaculation. Prostate cancer is one of the most common types of cancer in men. Who should have prostate cancer screening? Screening recommendations vary based on age and other risk factors, as well as between the professional organizations who make the recommendations. In general, screening is recommended if: You are age 27 to 38 and have an average risk for prostate cancer. You should talk with your health care provider about your need for screening and how often screening should be done. Because most prostate cancers are slow growing and will not cause death, screening in this age group is generally reserved for men who have a 10- to 15-year life expectancy. You are younger than age 24, and you have these risk factors: Having a father, brother, or uncle who has been diagnosed with prostate cancer. The risk is higher if your family member's cancer occurred at an early age or if you have multiple family members with prostate cancer at an early age. Being a male who is [place] or is of [place] or sub-Saharan African descent. In general, screening is not recommended if: You are younger than age 92. You are between the ages of 16 and 87 and you have no risk factors. You are 38 years of age or older. At this age, the risks that screening can cause are greater than the benefits that it may provide. If you are at high risk for prostate cancer, your health care provider may recommend that you have screenings more often or that you start screening at a younger age. How is screening for prostate cancer done? The recommended prostate cancer screening test is a blood test called the prostate-specific antigen  (PSA) test. PSA is a protein that is made in the prostate. As you age, your prostate naturally produces more PSA. Abnormally high PSA levels may be caused by: Prostate cancer. An enlarged prostate that is not caused by cancer (benign prostatic hyperplasia, or BPH). This condition is very common in older men. A prostate gland infection (prostatitis) or urinary tract infection. Certain medicines such as male hormones (like testosterone) or other medicines that raise testosterone levels. A rectal exam may be done as part of prostate cancer screening to help provide information about the size of your prostate gland. When a rectal exam is performed, it should be done after the PSA level is drawn to avoid any effect on the results. Depending on the PSA results, you may need more tests, such as: A physical exam to check the size of your prostate gland, if not done as part of screening. Blood and imaging tests. A procedure to remove tissue samples from your prostate gland for testing (biopsy). This is the only way to know for certain if you have prostate cancer. What are the benefits of prostate cancer screening? Screening can help to identify cancer at an early stage, before symptoms start and when the cancer can be treated more easily. There is a small chance that screening may lower your risk of dying from prostate cancer. The chance is small because prostate cancer is a slow-growing cancer, and most men with prostate cancer die from a different cause. What are the risks of prostate cancer screening? The  main risk of prostate cancer screening is diagnosing and treating prostate cancer that would never have caused any symptoms or problems. This is called overdiagnosisand overtreatment. PSA screening cannot tell you if your PSA is high due to cancer or a different cause. A prostate biopsy is the only procedure to diagnose prostate cancer. Even the results of a biopsy may not tell you if your cancer needs to  be treated. Slow-growing prostate cancer may not need any treatment other than monitoring, so diagnosing and treating it may cause unnecessary stress or other side effects. Questions to ask your health care provider When should I start prostate cancer screening? What is my risk for prostate cancer? How often do I need screening? What type of screening tests do I need? How do I get my test results? What do my results mean? Do I need treatment? Where to find more information The American Cancer Society: www.cancer.org American Urological Association: www.auanet.org Contact a health care provider if: You have difficulty urinating. You have pain when you urinate or ejaculate. You have blood in your urine or semen. You have pain in your back or in the area of your prostate. Summary Prostate cancer is a common type of cancer in men. The prostate gland is located below the bladder and in front of the rectum. This gland adds fluid to semen during ejaculation. Prostate cancer screening may identify cancer at an early stage, when the cancer can be treated more easily and is less likely to have spread to other areas of the body. The prostate-specific antigen (PSA) test is the recommended screening test for prostate cancer, but it has associated risks. Discuss the risks and benefits of prostate cancer screening with your health care provider. If you are age 47 or older, the risks that screening can cause are greater than the benefits that it may provide. This information is not intended to replace advice given to you by your health care provider. Make sure you discuss any questions you have with your health care provider. Document Revised: 10/31/2020 Document Reviewed: 10/31/2020 Elsevier Patient Education  2024 Elsevier Inc.  Diverticulitis  Diverticulitis happens when poop (stool) and bacteria get trapped in small pouches in the colon called diverticula. These pouches may form if you have a  condition called diverticulosis. When the poop and bacteria get trapped, it can cause an infection and inflammation. Diverticulitis may cause severe stomach pain and diarrhea. It can also lead to tissue damage in your colon. This can cause bleeding or blockage. In some cases, the diverticula may burst (rupture). This can cause infected poop to go into other parts of your abdomen. What are the causes? This condition is caused by poop getting trapped in the diverticula. This allows bacteria to grow. It can lead to inflammation and infection. What increases the risk? You are more likely to get this condition if you have diverticulosis. You are also more at risk if: You are overweight or obese. You do not get enough exercise. You drink alcohol. You smoke. You eat a lot of red meat, such as beef, pork, or lamb. You do not get enough fiber. Foods high in fiber include fruits, vegetables, beans, nuts, and whole grains. You are over 31 years of age. What are the signs or symptoms? Symptoms of this condition may include: Pain and tenderness in the abdomen. This pain is often felt on the left side but may occur in other spots. Fever and chills. Nausea and vomiting. Cramping. Bloating. Changes in how often  you poop. Blood in your poop. How is this diagnosed? This condition is diagnosed based on your medical history and a physical exam. You may also have tests done to make sure there is nothing else causing your condition. These tests may include: Blood tests. Tests done on your pee (urine). A CT scan of the abdomen. You may need to have a colonoscopy. This is an exam to look at your whole large intestine. During the exam, a tube is put into the opening of your butt (anus) and then moved into your rectum, colon, and other parts of the large intestine. This exam is done to look at the diverticula. It can also see if there is something else that may be causing your symptoms. How is this treated? Most  cases are mild and can be treated at home. You may be told to: Take over-the-counter pain medicine. Only eat and drink clear liquids. Take antibiotics. Rest. More severe cases may need to be treated at a hospital. Treatment may include: Not eating or drinking. Taking pain medicines. Getting antibiotics through an IV. Getting fluids and nutrition through an IV. Surgery. Follow these instructions at home: Medicines Take over-the-counter and prescription medicines only as told by your health care provider. These include fiber supplements, probiotics, and medicines to soften your poop (stool softeners). If you were prescribed antibiotics, take them as told by your provider. Do not stop using the antibiotic even if you start to feel better. Ask your provider if the medicine prescribed to you requires you to avoid driving or using machinery. Eating and drinking  Follow the diet told by your provider. You may need to only eat and drink liquids. After your symptoms get better, you may be able to return to a more normal diet. You may be told to eat at least 25 grams (25 g) of fiber each day. Fiber makes it easier to poop. Healthy sources of fiber include: Berries. One cup has 4-8 g of fiber. Beans or lentils. One-half cup has 5-8 g of fiber. Green vegetables. One cup has 4 g of fiber. Avoid eating red meat. General instructions Do not use any products that contain nicotine or tobacco. These products include cigarettes, chewing tobacco, and vaping devices, such as e-cigarettes. If you need help quitting, ask your provider. Exercise for at least 30 minutes, 3 times a week. Exercise hard enough to raise your heart rate and break a sweat. Contact a health care provider if: Your pain gets worse. Your pooping does not go back to normal. Your symptoms do not get better with treatment. Your symptoms get worse all of a sudden. You have a fever. You vomit more than one time. Your poop is bloody,  black, or tarry. This information is not intended to replace advice given to you by your health care provider. Make sure you discuss any questions you have with your health care provider. Document Revised: 02/01/2022 Document Reviewed: 02/01/2022 Elsevier Patient Education  2024 [employer].
